# Patient Record
Sex: Female | Born: 1955 | Race: White | Hispanic: No | Smoking: Never smoker
Health system: Southern US, Community
[De-identification: ages and names within clinical notes are randomized; demographics above are authoritative.]

## PROBLEM LIST (undated history)

## (undated) DIAGNOSIS — Z9889 Other specified postprocedural states: Secondary | ICD-10-CM

## (undated) DIAGNOSIS — R112 Nausea with vomiting, unspecified: Secondary | ICD-10-CM

## (undated) DIAGNOSIS — R011 Cardiac murmur, unspecified: Secondary | ICD-10-CM

## (undated) DIAGNOSIS — C50919 Malignant neoplasm of unspecified site of unspecified female breast: Principal | ICD-10-CM

## (undated) HISTORY — DX: Malignant neoplasm of unspecified site of unspecified female breast: C50.919

## (undated) HISTORY — PX: BREAST EXCISIONAL BIOPSY: SUR124

## (undated) HISTORY — PX: BREAST LUMPECTOMY: SHX2

---

## 1984-01-22 HISTORY — PX: MYOMECTOMY: SHX85

## 1998-03-01 ENCOUNTER — Encounter: Payer: Self-pay | Admitting: *Deleted

## 1998-03-01 ENCOUNTER — Ambulatory Visit (HOSPITAL_COMMUNITY): Admission: RE | Admit: 1998-03-01 | Discharge: 1998-03-01 | Payer: Self-pay | Admitting: *Deleted

## 1998-10-20 ENCOUNTER — Ambulatory Visit (HOSPITAL_COMMUNITY): Admission: RE | Admit: 1998-10-20 | Discharge: 1998-10-20 | Payer: Self-pay | Admitting: Obstetrics and Gynecology

## 1999-10-08 ENCOUNTER — Other Ambulatory Visit: Admission: RE | Admit: 1999-10-08 | Discharge: 1999-10-08 | Payer: Self-pay | Admitting: Obstetrics and Gynecology

## 1999-10-09 ENCOUNTER — Encounter: Admission: RE | Admit: 1999-10-09 | Discharge: 1999-10-09 | Payer: Self-pay | Admitting: Obstetrics and Gynecology

## 1999-10-09 ENCOUNTER — Encounter: Payer: Self-pay | Admitting: Obstetrics and Gynecology

## 2000-01-31 ENCOUNTER — Other Ambulatory Visit: Admission: RE | Admit: 2000-01-31 | Discharge: 2000-01-31 | Payer: Self-pay | Admitting: Obstetrics and Gynecology

## 2002-09-15 ENCOUNTER — Encounter: Admission: RE | Admit: 2002-09-15 | Discharge: 2002-09-15 | Payer: Self-pay | Admitting: Obstetrics and Gynecology

## 2002-09-15 ENCOUNTER — Encounter: Payer: Self-pay | Admitting: Obstetrics and Gynecology

## 2004-06-27 ENCOUNTER — Encounter: Admission: RE | Admit: 2004-06-27 | Discharge: 2004-06-27 | Payer: Self-pay | Admitting: Obstetrics and Gynecology

## 2010-01-21 HISTORY — PX: MASTECTOMY: SHX3

## 2010-11-15 ENCOUNTER — Other Ambulatory Visit: Payer: Self-pay | Admitting: Obstetrics and Gynecology

## 2010-11-15 DIAGNOSIS — N6320 Unspecified lump in the left breast, unspecified quadrant: Secondary | ICD-10-CM

## 2010-11-28 ENCOUNTER — Ambulatory Visit
Admission: RE | Admit: 2010-11-28 | Discharge: 2010-11-28 | Disposition: A | Discharge status: Home / Self Care | Payer: BC Managed Care – PPO | Source: Ambulatory Visit | Attending: Obstetrics and Gynecology | Admitting: Obstetrics and Gynecology

## 2010-11-28 ENCOUNTER — Other Ambulatory Visit: Payer: Self-pay | Admitting: Obstetrics and Gynecology

## 2010-11-28 ENCOUNTER — Other Ambulatory Visit: Payer: Self-pay | Admitting: Diagnostic Radiology

## 2010-11-28 DIAGNOSIS — N6320 Unspecified lump in the left breast, unspecified quadrant: Secondary | ICD-10-CM

## 2010-11-29 ENCOUNTER — Ambulatory Visit
Admission: RE | Admit: 2010-11-29 | Discharge: 2010-11-29 | Disposition: A | Discharge status: Home / Self Care | Payer: BC Managed Care – PPO | Source: Ambulatory Visit | Attending: Obstetrics and Gynecology | Admitting: Obstetrics and Gynecology

## 2010-11-29 DIAGNOSIS — N6320 Unspecified lump in the left breast, unspecified quadrant: Secondary | ICD-10-CM

## 2010-11-30 ENCOUNTER — Telehealth: Payer: Self-pay | Admitting: *Deleted

## 2010-11-30 ENCOUNTER — Other Ambulatory Visit: Payer: Self-pay | Admitting: Obstetrics and Gynecology

## 2010-11-30 ENCOUNTER — Other Ambulatory Visit: Payer: Self-pay | Admitting: *Deleted

## 2010-11-30 DIAGNOSIS — C50919 Malignant neoplasm of unspecified site of unspecified female breast: Secondary | ICD-10-CM

## 2010-11-30 DIAGNOSIS — C50912 Malignant neoplasm of unspecified site of left female breast: Secondary | ICD-10-CM

## 2010-11-30 NOTE — Telephone Encounter (Signed)
Confirmed [facility] appt for 12/05/10.  Gave pt instructions and contact information, should questions arise.

## 2010-12-03 ENCOUNTER — Ambulatory Visit
Admission: RE | Admit: 2010-12-03 | Discharge: 2010-12-03 | Disposition: A | Discharge status: Home / Self Care | Payer: BC Managed Care – PPO | Source: Ambulatory Visit | Attending: Obstetrics and Gynecology | Admitting: Obstetrics and Gynecology

## 2010-12-03 DIAGNOSIS — C50912 Malignant neoplasm of unspecified site of left female breast: Secondary | ICD-10-CM

## 2010-12-03 MED ORDER — GADOBENATE DIMEGLUMINE 529 MG/ML IV SOLN
13.0000 mL | Freq: Once | INTRAVENOUS | Status: AC | PRN
  Administered 2010-12-03: 13 mL via INTRAVENOUS

## 2010-12-04 ENCOUNTER — Other Ambulatory Visit: Payer: Self-pay | Admitting: Obstetrics and Gynecology

## 2010-12-04 DIAGNOSIS — R921 Mammographic calcification found on diagnostic imaging of breast: Secondary | ICD-10-CM

## 2010-12-05 ENCOUNTER — Ambulatory Visit
Admission: RE | Admit: 2010-12-05 | Discharge: 2010-12-05 | Disposition: A | Discharge status: Home / Self Care | Payer: BC Managed Care – PPO | Source: Ambulatory Visit | Attending: Radiation Oncology | Admitting: Radiation Oncology

## 2010-12-05 ENCOUNTER — Ambulatory Visit (HOSPITAL_BASED_OUTPATIENT_CLINIC_OR_DEPARTMENT_OTHER): Payer: BC Managed Care – PPO | Admitting: Oncology

## 2010-12-05 ENCOUNTER — Encounter: Payer: Self-pay | Admitting: Oncology

## 2010-12-05 ENCOUNTER — Ambulatory Visit: Payer: BC Managed Care – PPO

## 2010-12-05 ENCOUNTER — Telehealth: Payer: Self-pay | Admitting: *Deleted

## 2010-12-05 ENCOUNTER — Ambulatory Visit (HOSPITAL_BASED_OUTPATIENT_CLINIC_OR_DEPARTMENT_OTHER): Payer: BC Managed Care – PPO | Admitting: General Surgery

## 2010-12-05 ENCOUNTER — Encounter (INDEPENDENT_AMBULATORY_CARE_PROVIDER_SITE_OTHER): Payer: Self-pay | Admitting: General Surgery

## 2010-12-05 ENCOUNTER — Ambulatory Visit: Payer: BC Managed Care – PPO | Attending: General Surgery | Admitting: Physical Therapy

## 2010-12-05 ENCOUNTER — Other Ambulatory Visit (HOSPITAL_BASED_OUTPATIENT_CLINIC_OR_DEPARTMENT_OTHER): Payer: BC Managed Care – PPO | Admitting: Lab

## 2010-12-05 VITALS — BP 132/75 | HR 59 | Temp 98.4°F | Ht 70.1 in | Wt 145.1 lb

## 2010-12-05 DIAGNOSIS — C50419 Malignant neoplasm of upper-outer quadrant of unspecified female breast: Secondary | ICD-10-CM

## 2010-12-05 DIAGNOSIS — C50919 Malignant neoplasm of unspecified site of unspecified female breast: Secondary | ICD-10-CM

## 2010-12-05 DIAGNOSIS — C50912 Malignant neoplasm of unspecified site of left female breast: Secondary | ICD-10-CM

## 2010-12-05 DIAGNOSIS — IMO0001 Reserved for inherently not codable concepts without codable children: Secondary | ICD-10-CM | POA: Insufficient documentation

## 2010-12-05 DIAGNOSIS — Z170 Estrogen receptor positive status [ER+]: Secondary | ICD-10-CM

## 2010-12-05 DIAGNOSIS — C50412 Malignant neoplasm of upper-outer quadrant of left female breast: Secondary | ICD-10-CM | POA: Insufficient documentation

## 2010-12-05 HISTORY — DX: Malignant neoplasm of unspecified site of unspecified female breast: C50.919

## 2010-12-05 LAB — CBC WITH DIFFERENTIAL/PLATELET
Basophils Absolute: 0 10*3/uL (ref 0.0–0.1)
Eosinophils Absolute: 0 10*3/uL (ref 0.0–0.5)
HCT: 39 % (ref 34.8–46.6)
HGB: 13.4 g/dL (ref 11.6–15.9)
MCV: 89.6 fL (ref 79.5–101.0)
MONO%: 8.1 % (ref 0.0–14.0)
NEUT#: 5.1 10*3/uL (ref 1.5–6.5)
NEUT%: 72 % (ref 38.4–76.8)
Platelets: 186 10*3/uL (ref 145–400)
RDW: 12.6 % (ref 11.2–14.5)

## 2010-12-05 LAB — COMPREHENSIVE METABOLIC PANEL
Albumin: 4 g/dL (ref 3.5–5.2)
Alkaline Phosphatase: 55 U/L (ref 39–117)
BUN: 9 mg/dL (ref 6–23)
Calcium: 9.5 mg/dL (ref 8.4–10.5)
Creatinine, Ser: 0.83 mg/dL (ref 0.50–1.10)
Glucose, Bld: 85 mg/dL (ref 70–99)
Potassium: 3.5 mEq/L (ref 3.5–5.3)

## 2010-12-05 NOTE — Patient Instructions (Signed)
You will be scheduled for surgery for your left breast cancer. The surgery that we have decided upon is a left total mastectomy and left axillary sentinel node biopsy. My office will coordinate the surgery with you. You will also be referred to a plastic surgeon to discuss reconstructive options for the future.                                                           Mastectomy, With or Without Reconstruction Mastectomy (removal of the breast) is a procedure most commonly used to treat cancer (tumor) of the breast. Different procedures are available for treatment. This depends on the stage of the tumor (abnormal growths). Discuss this with your caregiver, surgeon (a specialist for performing operations such as this), or oncologist (someone specialized in the treatment of cancer). With proper information, you can decide which treatment is best for you. Although the sound of the word cancer is frightening to all of [place], the new treatments and medications can be a source of reassurance and comfort. If there are things you are worried about, discuss them with your caregiver. He or she can help comfort you and your family. Some of the different procedures for treating breast cancer are:  Radical (extensive) mastectomy. This is an operation used to remove the entire breast, the muscles under the breast, and all of the glands (lymph nodes) under the arm. With all of the new treatments available for cancer of the breast, this procedure has become less common.   Modified radical mastectomy. This is a similar operation to the radical mastectomy described above. In the modified radical mastectomy, the muscles of the chest wall are not removed unless one of the lessor muscles is removed. One of the lessor muscles may be removed to allow better removal of the lymph nodes. The axillary lymph nodes are also removed. Rarely, during an axillary node dissection nerves to this area are damaged. Radiation therapy is then often  used to the area following this surgery.   A total mastectomy also known as a complete or simple mastectomy. It involves removal of only the breast. The lymph nodes and the muscles are left in place.   In a lumpectomy, the lump is removed from the breast. This is the simplest form of surgical treatment. A sentinel lymph node biopsy may also be done. Additional treatment may be required.  RISKS AND COMPLICATIONS The main problems that follow removal of the breast include:  Infection (germs start growing in the wound). This can usually be treated with antibiotics (medications that kill germs).   Lymphedema. This means the arm on the side of the breast that was operated on swells because the lymph (tissue fluid) cannot follow the main channels back into the body. This only occurs when the lymph nodes have had to be removed under the arm.   There may be some areas of numbness to the upper arm and around the incision (cut by the surgeon) in the breast. This happens because of the cutting of or damage to some of the nerves in the area. This is most often unavoidable.   There may be difficultymoving the arm in a full range of motion (moving in all directions) following surgery. This usually improves with time following use and exercise.  Recurrence of breast cancer may happen with the very best of surgery and follow up treatment. Sometimes small cancer cells that cannot be seen with the naked eye have already spread at the time of surgery. When this happens other treatment is available. This treatment may be radiation, medications or a combination of both.  RECONSTRUCTION Reconstruction of the breast may be done immediately if there is not going to be post-operative radiation. This surgery is done for cosmetic (improve appearance) purposes to improve the physical appearance after the operation. This may be done in two ways:  It can be done using a saline filled prosthetic (an artificial breast  which is filled with salt water). Silicone breast implants are now re-approved by the FDA and are being commonly used.   Reconstruction can be done using the body's own muscle/fat/skin.  Your caregiver will discuss your options with you. Depending upon your needs or choice, together you will be able to determine which procedure is best for you. Document Released: 10/02/2000 Document Revised: 09/19/2010 Document Reviewed: 05/26/2007 [facility] Patient Information 2012 [place], [place]. office will coordinate the surgery with you. We are also going to refer you to a plastic surgeon to discuss future options for reconstruction.

## 2010-12-05 NOTE — Progress Notes (Signed)
No chief complaint on file.   HPI Daisy Thomas is a 55 y.o. female.  She is referred by Dr. Norva Pavlov at the breast center of [facility] for evaluation of a newly diagnosed invasive cancer of the left breast.  The patient states that she felt a lump in the superior aspect of her left breast recently. She says this is large. Mammogram and ultrasound show a mass at the 12:00 position. There is also a separate, area of suspicious calcifications medially at the 9:00 position. Image guided biopsy of the mass at the 12:00 position reveals invasive lobular carcinoma, ER-positive, PR positive, HER-2/neu negative, Ki-67 15%. This is a clinical stage T2, N0 tumor. The area of calcifications at the 9 by position has not been biopsied yet.  The patient is only motivated to proceed with mastectomy, and is not interested in biopsying the second area of calcifications. We talked about this at length. We talked about the difference between lumpectomy and mastectomy. We talked about sentinel node biopsy. We talked about the differences between ductal carcinoma and lobular carcinoma. She has been offered a referral to a Engineer, petroleum and she is going to do that to discuss options, however she states that she really would rather have the reconstruction  in a delayed fashion, not immediate.  She is otherwise healthy. She does not smoke. She is on no medications.  Family history reveals breast cancer in an aunt at age 29. Otherwise no familial cancer history. HPI  Past Medical History  Diagnosis Date  . Diverticulitis 2010  . Breast cancer 12/05/2010    Past Surgical History  Procedure Date  . Myomectomy 1986    ovarian fibroid  . Cesarean section 1990  . Breast lumpectomy     left breast     No family history on file.  Social History History  Substance Use Topics  . Smoking status: Never Smoker   . Smokeless tobacco: Never Used  . Alcohol Use: No    No Known Allergies  No current  outpatient prescriptions on file.    Review of Systems Review of Systems  Constitutional: Negative for fever, chills and unexpected weight change.  HENT: Negative for hearing loss, congestion, sore throat, trouble swallowing and voice change.   Eyes: Negative for visual disturbance.  Respiratory: Negative for cough and wheezing.   Cardiovascular: Negative for chest pain, palpitations and leg swelling.  Gastrointestinal: Negative for nausea, vomiting, abdominal pain, diarrhea, constipation, blood in stool, abdominal distention and anal bleeding.  Genitourinary: Negative for hematuria, vaginal bleeding and difficulty urinating.  Musculoskeletal: Negative for arthralgias.  Skin: Negative for rash and wound.  Neurological: Negative for seizures, syncope and headaches.  Hematological: Negative for adenopathy. Does not bruise/bleed easily.  Psychiatric/Behavioral: Negative for confusion.    There were no vitals taken for this visit.  Physical Exam Physical Exam  Constitutional: She is oriented to person, place, and time. She appears well-developed and well-nourished. No distress.  HENT:  Head: Normocephalic and atraumatic.  Nose: Nose normal.  Mouth/Throat: No oropharyngeal exudate.  Eyes: Conjunctivae and EOM are normal. Pupils are equal, round, and reactive to light. Left eye exhibits no discharge. No scleral icterus.  Neck: Neck supple. No JVD present. No tracheal deviation present. No thyromegaly present.  Cardiovascular: Normal rate, regular rhythm, normal heart sounds and intact distal pulses.   No murmur heard. Pulmonary/Chest: Effort normal and breath sounds normal. No respiratory distress. She has no wheezes. She has no rales. She exhibits no tenderness.  Abdominal: Soft. Bowel sounds are normal. She exhibits no distension and no mass. There is no tenderness. There is no rebound and no guarding.       C-section scar  Musculoskeletal: She exhibits no edema and no tenderness.    Lymphadenopathy:    She has no cervical adenopathy.  Neurological: She is alert and oriented to person, place, and time. She exhibits normal muscle tone. Coordination normal.  Skin: Skin is warm. No rash noted. She is not diaphoretic. No erythema. No pallor.  Psychiatric: She has a normal mood and affect. Her behavior is normal. Judgment and thought content normal.    Data Reviewed I have reviewed her imaging studies and her pathology. I have discussed her case in breast conference this morning. I have consulted Dr. Drue Second and Dr. Antony Blackbird regarding treatment options. I have referred her to a Engineer, petroleum for consultation.  Assessment    Invasive lobular carcinoma, left breast, 12:00 position, 3 cm tumor by exam. ER positive. PR positive. HER-2/neu negative. Ki 67 15%.  Patient desires proceeding with left total mastectomy and sentinel node biopsy.    Plan    The patient will be scheduled for left total mastectomy with sentinel lymph node mapping and biopsy.  The patient will also be referred for plastic surgery consultation, however she states she is not interested in immediate reconstruction. She is considering delayed reconstruction.  I have discussed the indications and details of her surgery with her. Risks and complications have been outlined, including but not limited to bleeding, infection, skin necrosis, reoperation for positive margins or positive nodes, arm numbness, or swelling, shoulder contracture, cardiac pulmonary and thromboembolic problems. She seems to understand these issues well. At this time all of her questions were answered. She is in full agreement with this plan.       Daisy Thomas M 12/05/2010, 3:50 PM

## 2010-12-05 NOTE — Progress Notes (Signed)
CC:   Malachi Pro. Ambrose Mantle, M.D. Angelia Mould. Derrell Lolling, M.D. Drue Second, M.D.  DIAGNOSIS:  Clinical stage II invasive lobular carcinoma of the left breast.  REQUESTING PHYSICIAN:  Dr. Malachi Pro. Henley.  HISTORY OF PRESENT ILLNESS:  Daisy Thomas is a very pleasant 55 year old female who seen out of the courtesy of Dr. Tracey Harries as part of the multidisciplinary breast clinic.  Earlier this year, Daisy Thomas palpated a mass within the upper medial aspect of her left breast.  The patient subsequently proceeded to undergo bilateral digital diagnostic mammography and left breast ultrasound.  The patient was noted to have an extremely dense breast.  However, within the upper central portion of the left breast, there was distortion associated with a partially visualized irregular mass.  Ultrasound of the left breast reveals an irregular hypoechoic mass in the 12 o'clock position approximately 3 cm from the nipple and measuring 2.9 x 1.5 x 3.8 cm.  The patient proceeded to undergo biopsy of this area revealing invasive mammary carcinoma.  E- cadherin stain was negative, confirming the lobular nature of the patient's carcinoma.  It was estrogen receptor positive at 99% and progesterone receptor positive at 100%.  Proliferation marker was 13% (Ki-67).  In light of the above findings, the patient did proceed to undergo an MRI of the breast region which showed a 2.1 x 2.0 x 2.1 cm enhancing mass within the 12 o'clock position of the left breast.  In addition, there was a cluster of calcifications within the medial aspect of the left breast which were suspicious for malignancy, and stereotactic biopsy was recommended at a later date.  Due to the above findings, the patient is now seen in the multidisciplinary breast clinic.  PAST MEDICAL HISTORY:  The patient has no known drug allergies.  The patient is currently on no prescription medications.  The patient denies any chronic medical problems.   The patient did have a history of diverticulitis in 2010.  Prior surgeries include myomectomy in '86 for a fibroid.  The patient also had a C-section in 1990.  A lumpectomy of the left breast for benign reasons.  The patient has also had a biopsy of the right breast for calcifications, which was also benign.  SOCIAL HISTORY:  The patient lives in the [place] area ([place], [place]).  The patient, however, works full time in the [place] area and has an apartment in [place] for staying overnight during the weekdays.  The patient is a Public house manager Loss adjuster, chartered for qualified retirement plans).  The patient has 1 son who is a Consulting civil engineer at [employer] There is no history of tobacco use.  The patient has rare alcohol intake approximately once per month.  FAMILY HISTORY:  Significant for her a maternal aunt with a history of breast cancer in her early 11s, also significant for a maternal grandmother with a history of lung cancer in her 48s.  There is no family history of ovarian cancer.  REVIEW OF SYSTEMS:  As above, the patient palpated the area of concern on self-examination.  She denied any pain in this area, nipple discharge or bleeding prior to diagnosis.  The patient denies any new bony pain, headaches, dizziness or blurred vision.  A complete review of systems is undertaken with the patient as well as reviewed in the electronic medical record and, other than the above issues, is unremarkable.  PHYSICAL EXAMINATION:  General:  This is a very pleasant, healthy- appearing 55 year old female in no acute distress.  The patient is accompanied by a good friend as well as her sister on evaluation today. Lymphatics:  Examination of the neck and supraclavicular region reveals no evidence of adenopathy.  The axillary areas are free of adenopathy. Lungs:  Examination of the lungs reveals them to be clear.  Heart:  The heart has a regular rhythm and rate.  Breasts:  Examination of  the right breast reveals no mass or nipple discharge.  There are some fibrocystic changes noted throughout the breast.  Examination of the left breast reveals a palpable mass in the upper aspect of the breast at the 11 to 12 o'clock position.  This is estimated to be approximately 5 cm in size.  There is no nipple discharge or bleeding noted.  There is no other palpable abnormality within the breast.  When lying flat, the breast mass does protrude out from the adjacent breast tissue, but no obvious skin involvement is noted.  IMPRESSION AND PLAN:  Clinical stage II invasive lobular carcinoma of the left breast.  The patient has a rather large palpable mass particularly as it relates to the patient's breast size.  At this point, it would be very difficult for the patient to proceed with breast conserving therapy without significant architectural distortion of the breast.  The patient has an invasive lobular tumor that may not respond well to neoadjuvant treatment.  Today, I did discuss with patient consideration for possible neoadjuvant treatment but would recommend biopsy of the 2nd area of concern in the medial aspect of the breast. If this biopsy is positive, then the patient in would be considered to have multicentric disease and would not be a candidate for breast conserving therapy.  At this point, the patient does seem to be leaning towards a mastectomy in the above issues.  The patient has already met with Dr. Welton Flakes and is leaning towards this treatment approach.  In addition, the patient will be meeting with Dr. Derrell Lolling later today for further discussion of surgical options.  It the patient does proceed with a mastectomy, given the size of the lesion and depending on the final pathologic results, the patient may benefit from postmastectomy irradiation.  In light of the potential recommendation for postmastectomy irradiation, I did discuss with Daisy Thomas that she not consider  immediate reconstruction but did encourage her to pursue plastic surgery evaluation as part of her overall evaluation.  In addition, given the patient's work in the [place] area, if radiation therapy is recommended, she may for convenience issues wish to pursue radiation therapy in this area.    ______________________________ Billie Lade, Ph.D., M.D. JDK/MEDQ  D:  12/05/2010  T:  12/05/2010  Job:  [phone]

## 2010-12-05 NOTE — Progress Notes (Signed)
Daisy Thomas [id number] 22-Mar-1955 55 y.o. 12/05/2010 8:46 PM  CC Dr. Claud Kelp Dr. Antony Blackbird  REASON FOR CONSULTATION:  55 yo with new invasive lobular carcinoma of the left breast. Being seen in the [facility] for discussion of treatment options   REFERRING PHYSICIAN: Dr. Cain Saupe  HISTORY OF PRESENT ILLNESS:  Daisy Thomas is a 55 y.o. female.  With  Out significant past medical history who felt a mass in her left breast in the upper outer quadrant. She went for a diagnostic mammogram. The mammogram showed a mass in the outer quadrant. Ultrasound showed the mass  At the 12:00 position and measured about 3 cm. Biopsy revealed an invasive lobular carcinoma, ER positive, PR+ with Ki-67 at 15%, and Her2Neu negative. MRI of breasts confirmed the mass. Patient now seen in [facility] for treatment options.   Past Medical History  Diagnosis Date  . Diverticulitis 2010  . Breast cancer 12/05/2010    Past Surgical History  Procedure Date  . Myomectomy 1986    ovarian fibroid  . Cesarean section 1990  . Breast lumpectomy     left breast     FHX: Aunt with menausal breast cancer, no other history of breast, ovarian, lung , endometrial, or other malignancies  Social History History  Substance Use Topics  . Smoking status: Never Smoker   . Smokeless tobacco: Never Used  . Alcohol Use: No  divorced GYN HX: menarche at 16 she is post-menopausal, 1 full term pregancy, no previous use of HRT ALLERGIES No Known Allergies  Current Meds: none  REVIEW OF SYSTEMS:  Constitutional: negative Ears, nose, mouth, throat, and face: negative Respiratory: negative Cardiovascular: negative Gastrointestinal: negative Integument/breast: positive for breast lump and in the left breast in the upper outer quadrant meassuring about 5 cm Hematologic/lymphatic: negative Musculoskeletal:negative Neurological: negative  PHYSICAL EXAMINATION:  ION:GEXBM, healthy, no distress, well  nourished and well developed SKIN: skin color, texture, turgor are normal HEAD: No masses, lesions, tenderness or abnormalities EYES: normal, PERRLA, EOMI, Conjunctiva are pink and non-injected EARS: External ears normal OROPHARYNX:no exudate, no erythema and lips, buccal mucosa, and tongue normal  NECK: supple, no adenopathy, no bruits, no JVD, thyroid normal size, non-tender, without nodularity LYMPH:  no palpable lymphadenopathy, no hepatosplenomegaly BREAST:right breast normal without mass, skin or nipple changes or axillary nodes, abnormal mass palpable in the left breast  LUNGS: clear to auscultation and percussion HEART: regular rate & rhythm, no murmurs and no gallops ABDOMEN:abdomen soft, non-tender, normal bowel sounds and no masses or organomegaly BACK: Back symmetric, no curvature., No CVA tenderness, Range of motion is normal EXTREMITIES:no joint deformities, effusion, or inflammation, no edema  NEURO: alert & oriented x 3 with fluent speech, no focal motor/sensory deficits, gait normal, reflexes normal and symmetric    STUDIES/RESULTS: US Breast Left  2010/12/08  **ADDENDUM** CREATED: December 08, 2010 11:40:26  Corrected report:  *RADIOLOGY REPORT*  Clinical Data:  Status post ultrasound guided core biopsy of mass in the 12 o'clock location of the left breast.  DIGITAL DIAGNOSTIC LEFT MAMMOGRAM  Comparison:  Pre biopsy exams of 12/08/2010  Findings:  Films are performed following ultrasound guided biopsy of mass in the 12 o'clock location of the left breast.  A ribbon shaped clip is identified within the mass in the 12 o'clock location of the left breast.  IMPRESSION: Tissue marker clip is in expected location after biopsy.  Addended by:  Blair Hailey. Manson Passey, M.D. on 2010/12/08 11:40:26.  **END ADDENDUM** SIGNED BY: Blair Hailey. Manson Passey, M.D.  11/28/2010  *RADIOLOGY REPORT*  Clinical Data:  Palpable abnormality in the left breast.  DIGITAL DIAGNOSTIC BILATERAL MAMMOGRAM WITH CAD AND LEFT  BREAST ULTRASOUND:  Comparison:  06/27/2004  Findings:  Breast parenchyma is extremely dense.  Within the upper central portion of the left breast, there is distortion, associated with a partially visualized irregular mass.  This is marked with a BB as the area of patient's concern.  Spot tangential view confirms the above findings.  Within the medial portion of the left breast, there is a 6 mm cluster of calcifications.  Calcifications vary in size shape and density.  Images of the right breast are negative. Mammographic images were processed with CAD.  On physical exam, I palpate a firm mass in the 12 o'clock location of the left breast 3 cm from the nipple.  Ultrasound is performed, showing an irregular hypoechoic mass with internal blood flow in the 12 o'clock location of the left breast 3 cm from the nipple.  There is associated increased through transmission.  Mass measures 2.9 x 1.5 x 3.0 cm.  At ultrasound, faint, punctate internal calcifications are suspected.  Evaluation of the left axilla is negative.  IMPRESSION:  1.  Palpable mass is suspicious for invasive ductal carcinoma by ultrasound and mammogram.  Ultrasound-guided core biopsy is recommended. 2.  Cluster of calcifications in the medial aspect of the left breast is also suspicious.  Pending the biopsy of the mass and possible MRI, stereotactic guided core biopsy of this cluster of calcifications should be considered. 3.  No ultrasound evidence for axillary adenopathy.  I have discussed the findings with the patient.  She requests biopsy today.  This is performed and dictated separately.  BI-RADS CATEGORY 4:  Suspicious abnormality - biopsy should be considered.  Original Report Authenticated By: Patterson Hammersmith, M.D.   Mr Breast Bilateral W Wo Contrast  12/03/2010  *RADIOLOGY REPORT*  Clinical Data: Recently diagnosed invasive mammary carcinoma in the 12 o'clock region of the left breast.  At the time of the diagnostic study a cluster of  calcifications worrisome for DCIS was seen in the medial portion of the left breast.  BILATERAL BREAST MRI WITH AND WITHOUT CONTRAST  Technique: Multiplanar, multisequence MR images of both breasts were obtained prior to and following the intravenous administration of 13ml of multihance.  Three dimensional images were evaluated at the independent DynaCad workstation.  Comparison:  Mammograms dated 11/28/2010  Findings: There is a moderate background parenchymal enhancement pattern.  In the 12 o'clock region of the left breast there is a 2.1 x 2.0 x 2.1 cm enhancing mass associated with a biopsy clip artifact.  This corresponds well with the known malignancy.  No other areas of abnormal enhancement are seen in the left breast.  There is no enlarged axillary or internal mammary adenopathy.  No abnormal enhancement is seen in the right breast.  IMPRESSION:  1.  2.1 cm mass in the 12 o'clock region of the left breast corresponding well with the known malignancy. 2.  The cluster of calcifications in the medial aspect of the left breast are suspicious.  A stereotactic biopsy is recommended and will be scheduled at the patient's convenience.  THREE-DIMENSIONAL MR IMAGE RENDERING ON INDEPENDENT WORKSTATION:  Three-dimensional MR images were rendered by post-processing of the original MR data on an independent workstation.  The three- dimensional MR images were interpreted, and findings were reported in the accompanying complete MRI report for this study.  BI-RADS CATEGORY 6:  Known biopsy-proven malignancy -  appropriate action should be taken.  Original Report Authenticated By: Littie Deeds. Judyann Munson, M.D.   [place] Core Biopsy  11/28/2010  *RADIOLOGY REPORT*  Clinical Data:  Mass in the 12 o'clock location of the left breast.  ULTRASOUND GUIDED VACUUM ASSISTED CORE BIOPSY OF THE LEFT BREAST  I met with the patient, and we discussed the procedure of ultrasound-guided biopsy, including risks, benefits, and alternatives.  Specifically,  we discussed the risks of infection, bleeding, tissue injury, clip migration, and inadequate sampling. Informed, written consent was given.  Using sterile technique, 2% lidocaine, ultrasound guidance, and a 12 gauge vacuum assisted needle, biopsy was performed of left breast mass, using a lateral approach.  At the conclusion of the procedure, a ribbon shaped tissue marker clip was deployed into the biopsy cavity.  Follow-up 2-view mammogram was performed and dictated separately.  IMPRESSION: Ultrasound-guided biopsy of left breast mass.  No apparent complications.  Original Report Authenticated By: Patterson Hammersmith, M.D.   Mm Digital Diagnostic Bilat  11/28/2010  *RADIOLOGY REPORT*  Clinical Data:  Palpable abnormality in the left breast.  DIGITAL DIAGNOSTIC BILATERAL MAMMOGRAM WITH CAD AND LEFT BREAST ULTRASOUND:  Comparison:  06/27/2004  Findings:  Breast parenchyma is extremely dense.  Within the upper central portion of the left breast, there is distortion, associated with a partially visualized irregular mass.  This is marked with a BB as the area of patient's concern.  Spot tangential view confirms the above findings.  Within the medial portion of the left breast, there is a 6 mm cluster of calcifications.  Calcifications vary in size shape and density.  Images of the right breast are negative. Mammographic images were processed with CAD.  On physical exam, I palpate a firm mass in the 12 o'clock location of the left breast 3 cm from the nipple.  Ultrasound is performed, showing an irregular hypoechoic mass with internal blood flow in the 12 o'clock location of the left breast 3 cm from the nipple.  There is associated increased through transmission.  Mass measures 2.9 x 1.5 x 3.0 cm.  At ultrasound, faint, punctate internal calcifications are suspected.  Evaluation of the left axilla is negative.  IMPRESSION:  1.  Palpable mass is suspicious for invasive ductal carcinoma by ultrasound and mammogram.   Ultrasound-guided core biopsy is recommended. 2.  Cluster of calcifications in the medial aspect of the left breast is also suspicious.  Pending the biopsy of the mass and possible MRI, stereotactic guided core biopsy of this cluster of calcifications should be considered. 3.  No ultrasound evidence for axillary adenopathy.  I have discussed the findings with the patient.  She requests biopsy today.  This is performed and dictated separately.  BI-RADS CATEGORY 4:  Suspicious abnormality - biopsy should be considered.  Original Report Authenticated By: Patterson Hammersmith, M.D.   Mm Digital Diagnostic Unilat L  11/28/2010  **ADDENDUM** CREATED: 11/28/2010 11:40:26  Corrected report:  *RADIOLOGY REPORT*  Clinical Data:  Status post ultrasound guided core biopsy of mass in the 12 o'clock location of the left breast.  DIGITAL DIAGNOSTIC LEFT MAMMOGRAM  Comparison:  Pre biopsy exams of 11/28/2010  Findings:  Films are performed following ultrasound guided biopsy of mass in the 12 o'clock location of the left breast.  A ribbon shaped clip is identified within the mass in the 12 o'clock location of the left breast.  IMPRESSION: Tissue marker clip is in expected location after biopsy.  Addended by:  Blair Hailey. Manson Passey, M.D. on 11/28/2010 11:40:26.  **  END ADDENDUM** SIGNED BY: Blair Hailey. Manson Passey, M.D.   11/28/2010  *RADIOLOGY REPORT*  Clinical Data:  Palpable abnormality in the left breast.  DIGITAL DIAGNOSTIC BILATERAL MAMMOGRAM WITH CAD AND LEFT BREAST ULTRASOUND:  Comparison:  06/27/2004  Findings:  Breast parenchyma is extremely dense.  Within the upper central portion of the left breast, there is distortion, associated with a partially visualized irregular mass.  This is marked with a BB as the area of patient's concern.  Spot tangential view confirms the above findings.  Within the medial portion of the left breast, there is a 6 mm cluster of calcifications.  Calcifications vary in size shape and density.  Images of  the right breast are negative. Mammographic images were processed with CAD.  On physical exam, I palpate a firm mass in the 12 o'clock location of the left breast 3 cm from the nipple.  Ultrasound is performed, showing an irregular hypoechoic mass with internal blood flow in the 12 o'clock location of the left breast 3 cm from the nipple.  There is associated increased through transmission.  Mass measures 2.9 x 1.5 x 3.0 cm.  At ultrasound, faint, punctate internal calcifications are suspected.  Evaluation of the left axilla is negative.  IMPRESSION:  1.  Palpable mass is suspicious for invasive ductal carcinoma by ultrasound and mammogram.  Ultrasound-guided core biopsy is recommended. 2.  Cluster of calcifications in the medial aspect of the left breast is also suspicious.  Pending the biopsy of the mass and possible MRI, stereotactic guided core biopsy of this cluster of calcifications should be considered. 3.  No ultrasound evidence for axillary adenopathy.  I have discussed the findings with the patient.  She requests biopsy today.  This is performed and dictated separately.  BI-RADS CATEGORY 4:  Suspicious abnormality - biopsy should be considered.  Original Report Authenticated By: Patterson Hammersmith, M.D.   Mm Radiologist Eval And Mgmt  11/29/2010  *RADIOLOGY REPORT*  ESTABLISHED PATIENT OFFICE VISIT - LEVEL II [phone])  Chief Complaint:  The patient returns for follow-up after ultrasound guided core biopsy of the left breast performed on 11/28/2010.  History:  The patient has a palpable mass in the left breast. Mammogram and ultrasound show an irregular mass, suspicious for malignancy.  Ultrasound-guided core biopsy was performed without complications.  Pathology shows invasive mammary carcinoma with mammary carcinoma in situ present.  E-Cadherin stains are pending to determine whether the carcinoma is ductal or lobular.  The pathology correlates well with the imaging appearance.  Exam:  Biopsy site is  clean and dry in the upper portion of the left breast.  No hematoma is palpable.  Mass remains palpable in the upper central aspect of the breast.  Assessment and Plan:  The patient lives in [place] and works in [place].  Her family is in [place].  She is considering the multidisciplinary clinic in [place] and will let us know her plan.  If she is seen in [place], we will schedule MRI prior to her clinic visit.  The patient was given educational materials and a clinic packet.  Questions were answered and the patient was provided with my card if she has additional questions.  Original Report Authenticated By: Patterson Hammersmith, M.D.   PATHOLOGY: Left breast biopsy: invasive lobular carcinoma, ER+/PR+, her2neu negative, ki-67 15% LAB DATA: Is reviewed  ASSESSMENT    55 yo with new diagnosis of T2Nx (stage II)invasive lobular carcinoma, seen in [facility] for treatment options,she was seen by Dr. Derrell Lolling and  Dr. Gillian Shields. Extensive discussion and rational for traeatment. Patient was given options of breast conservation with sentinel lymph node biopsy vs a mastectomy. The ration for both approaches were discussed in great detail. We discussed the option of neoadjuvant treatment with anti-estrogen for about 6 -9 months if she wanted breast conservation. We discussed systemic treatment post surgery as well. I recommended possibility of doing an oncotype dx for planning of adjuvant treatment. We discussed rational for this in her situation she does have a T2 tumor.    PLAN:    Patient has opted for mastectomy and with sentinal biopsy. Breast reconstruction was discussed with her. She will be eventually seen by Plastic surgery for a consultation. She will follow up with me in after her mastectomy. At that time we discuss adjuvant therapy and her onotype dx results     All questions were answered. The patient knows to call the clinic with any problems, questions or concerns. We can certainly see the  patient much sooner if necessary.  Thank you so much for allowing me to participate in the care of Daisy Thomas. I will continue to follow up the patient with you and assist in her care.  I spent 40 minutes counseling the patient face to face. The total time spent in the appointment was 60 minutes.   Drue Second, MD Medical/Oncology [facility] [phone] (beeper) [phone] (Office)  12/05/2010, 8:46 PM 12/05/2010, 8:46 PM

## 2010-12-05 NOTE — Telephone Encounter (Signed)
Gave patient appointment for 01-11-2011 starting at 10:30am with labs

## 2010-12-06 ENCOUNTER — Encounter: Payer: Self-pay | Admitting: *Deleted

## 2010-12-06 NOTE — Progress Notes (Signed)
Late Entry from 12/05/10= Pt relate she does not want to have breast bx.  She has decided to have Left Mastectomy with reconstruction.  Left VM for Cathy at [facility] to cancel breast bx.

## 2010-12-06 NOTE — Progress Notes (Signed)
Mailed after appt letter to pt. 

## 2010-12-10 ENCOUNTER — Telehealth: Payer: Self-pay | Admitting: *Deleted

## 2010-12-10 NOTE — Telephone Encounter (Signed)
Spoke to pt concerning [facility] from 12/05/10.  Pt denies questions or concerns.  Pt relate she understands dx, prognostic panel, and staging.  Encourage pt to call with needs.  Received verbal understanding.  Contact information given.

## 2010-12-11 ENCOUNTER — Other Ambulatory Visit (INDEPENDENT_AMBULATORY_CARE_PROVIDER_SITE_OTHER): Payer: Self-pay | Admitting: General Surgery

## 2010-12-11 ENCOUNTER — Encounter (HOSPITAL_COMMUNITY): Payer: Self-pay | Admitting: Pharmacy Technician

## 2010-12-11 ENCOUNTER — Other Ambulatory Visit (HOSPITAL_COMMUNITY): Payer: Self-pay | Admitting: *Deleted

## 2010-12-12 ENCOUNTER — Other Ambulatory Visit (INDEPENDENT_AMBULATORY_CARE_PROVIDER_SITE_OTHER): Payer: Self-pay | Admitting: General Surgery

## 2010-12-12 ENCOUNTER — Encounter: Payer: Self-pay | Admitting: *Deleted

## 2010-12-12 DIAGNOSIS — C50912 Malignant neoplasm of unspecified site of left female breast: Secondary | ICD-10-CM

## 2010-12-12 NOTE — Progress Notes (Signed)
Mailed after appt letter to pt. 

## 2010-12-17 ENCOUNTER — Encounter: Payer: Self-pay | Admitting: *Deleted

## 2010-12-17 NOTE — Progress Notes (Signed)
Pt is scheduled for left mastectomy with SLN bx on 12/21/10.

## 2010-12-18 ENCOUNTER — Encounter (HOSPITAL_COMMUNITY): Payer: Self-pay

## 2010-12-18 ENCOUNTER — Encounter (HOSPITAL_COMMUNITY)
Admission: RE | Admit: 2010-12-18 | Discharge: 2010-12-18 | Disposition: A | Discharge status: Home / Self Care | Payer: BC Managed Care – PPO | Source: Ambulatory Visit | Attending: General Surgery | Admitting: General Surgery

## 2010-12-18 HISTORY — DX: Nausea with vomiting, unspecified: R11.2

## 2010-12-18 HISTORY — DX: Cardiac murmur, unspecified: R01.1

## 2010-12-18 HISTORY — DX: Other specified postprocedural states: Z98.890

## 2010-12-18 LAB — COMPREHENSIVE METABOLIC PANEL
ALT: 13 U/L (ref 0–35)
AST: 12 U/L (ref 0–37)
Alkaline Phosphatase: 61 U/L (ref 39–117)
CO2: 29 mEq/L (ref 19–32)
Calcium: 9.7 mg/dL (ref 8.4–10.5)
Chloride: 105 mEq/L (ref 96–112)
GFR calc Af Amer: >90 mL/min (ref >90)
GFR calc non Af Amer: >90 mL/min (ref >90)
Glucose, Bld: 82 mg/dL (ref 70–99)
Potassium: 4 mEq/L (ref 3.5–5.1)
Sodium: 143 mEq/L (ref 135–145)
Total Bilirubin: 0.3 mg/dL (ref 0.3–1.2)

## 2010-12-18 LAB — DIFFERENTIAL
Basophils Absolute: 0 10*3/uL (ref 0.0–0.1)
Eosinophils Absolute: 0.1 10*3/uL (ref 0.0–0.7)
Eosinophils Relative: 1 % (ref 0–5)
Lymphocytes Relative: 23 % (ref 12–46)
Monocytes Absolute: 0.8 10*3/uL (ref 0.1–1.0)

## 2010-12-18 LAB — CBC
Hemoglobin: 13.7 g/dL (ref 12.0–15.0)
MCH: 29.8 pg (ref 26.0–34.0)
Platelets: 206 10*3/uL (ref 150–400)
RBC: 4.6 MIL/uL (ref 3.87–5.11)
WBC: 7.9 10*3/uL (ref 4.0–10.5)

## 2010-12-18 LAB — SURGICAL PCR SCREEN: MRSA, PCR: NEGATIVE

## 2010-12-18 MED ORDER — HEPARIN SODIUM (PORCINE) 5000 UNIT/ML IJ SOLN: 5000.0000 [IU] | Freq: Once | INTRAMUSCULAR | Status: DC

## 2010-12-18 NOTE — Pre-Procedure Instructions (Signed)
20 Daisy Thomas  12/18/2010   Your procedure is scheduled on:  12/21/10  Report to [facility] at 530 AM.  Call this number if you have problems the morning of surgery: [phone]   Remember:   Do not eat food:After Midnight.  May have clear liquids: up to 4 Hours before arrival.  Clear liquids include soda, tea, black coffee, apple or grape juice, broth.  Take these medicines the morning of surgery with A SIP OF WATER: NONE   Do not wear jewelry, make-up or nail polish.  Do not wear lotions, powders, or perfumes. You may wear deodorant.  Do not shave 48 hours prior to surgery.  Do not bring valuables to the hospital.  Contacts, dentures or bridgework may not be worn into surgery.  Leave suitcase in the car. After surgery it may be brought to your room.  For patients admitted to the hospital, checkout time is 11:00 AM the day of discharge.   Patients discharged the day of surgery will not be allowed to drive home.  Name and phone number of your driver: FAMILY  Special Instructions: CHG Shower Use Special Wash: 1/2 bottle night before surgery and 1/2 bottle morning of surgery.   Please read over the following fact sheets that you were given: Pain Booklet, MRSA Information and Surgical Site Infection Prevention

## 2010-12-20 MED ORDER — CEFAZOLIN SODIUM 1-5 GM-% IV SOLN
1.0000 g | INTRAVENOUS | Status: DC
  Filled 2010-12-20: qty 50

## 2010-12-20 NOTE — H&P (Signed)
Daisy Thomas   12/05/2010 3:00 PM Office Visit  MRN: [id number]   Description: 56 year old female  Provider: Ernestene Mention, MD  Department: [facility]        Diagnoses     Cancer of left breast   - Primary    174.9           Progress Notes     Ernestene Mention, MD  12/05/2010  4:02 PM  Signed No chief complaint on file.   HPI Daisy Thomas is a 55 y.o. female.  She is referred by Dr. Norva Thomas at the breast center of [facility] for evaluation of a newly diagnosed invasive cancer of the left breast.   The patient states that she felt a lump in the superior aspect of her left breast recently. She says this is large. Mammogram and ultrasound show a mass at the 12:00 position. There is also a separate, area of suspicious calcifications medially at the 9:00 position. Image guided biopsy of the mass at the 12:00 position reveals invasive lobular carcinoma, ER-positive, PR positive, HER-2/neu negative, Ki-67 15%. This is a clinical stage T2, N0 tumor. The area of calcifications at the 9 by position has not been biopsied yet.   The patient is only motivated to proceed with mastectomy, and is not interested in biopsying the second area of calcifications. We talked about this at length. We talked about the difference between lumpectomy and mastectomy. We talked about sentinel node biopsy. We talked about the differences between ductal carcinoma and lobular carcinoma. She has been offered a referral to a Engineer, petroleum and she is going to do that to discuss options, however she states that she really would rather have the reconstruction  in a delayed fashion, not immediate.   She is otherwise healthy. She does not smoke. She is on no medications.   Family history reveals breast cancer in an aunt at age 52. Otherwise no familial cancer history. HPI    Past Medical History   Diagnosis  Date   .  Diverticulitis  2010   .  Breast cancer  12/05/2010         Past Surgical History   Procedure  Date   .  Myomectomy  1986       ovarian fibroid   .  Cesarean section  1990   .  Breast lumpectomy         left breast       No family history on file.   Social History History   Substance Use Topics   .  Smoking status:  Never Smoker    .  Smokeless tobacco:  Never Used   .  Alcohol Use:  No      No Known Allergies    No current outpatient prescriptions on file.      Review of Systems Review of Systems  Constitutional: Negative for fever, chills and unexpected weight change.  HENT: Negative for hearing loss, congestion, sore throat, trouble swallowing and voice change.   Eyes: Negative for visual disturbance.  Respiratory: Negative for cough and wheezing.   Cardiovascular: Negative for chest pain, palpitations and leg swelling.  Gastrointestinal: Negative for nausea, vomiting, abdominal pain, diarrhea, constipation, blood in stool, abdominal distention and anal bleeding.  Genitourinary: Negative for hematuria, vaginal bleeding and difficulty urinating.  Musculoskeletal: Negative for arthralgias.  Skin: Negative for rash and wound.  Neurological: Negative for seizures, syncope and headaches.  Hematological: Negative for adenopathy. Does not  bruise/bleed easily.  Psychiatric/Behavioral: Negative for confusion.    There were no vitals taken for this visit.   Physical Exam Physical Exam  Constitutional: She is oriented to person, place, and time. She appears well-developed and well-nourished. No distress.  HENT:   Head: Normocephalic and atraumatic.   Nose: Nose normal.   Mouth/Throat: No oropharyngeal exudate.  Eyes: Conjunctivae and EOM are normal. Pupils are equal, round, and reactive to light. Left eye exhibits no discharge. No scleral icterus.  Neck: Neck supple. No JVD present. No tracheal deviation present. No thyromegaly present.  Cardiovascular: Normal rate, regular rhythm, normal heart sounds and intact distal  pulses.    No murmur heard. Pulmonary/Chest: Effort normal and breath sounds normal. No respiratory distress. She has no wheezes. She has no rales. She exhibits no tenderness.    Abdominal: Soft. Bowel sounds are normal. She exhibits no distension and no mass. There is no tenderness. There is no rebound and no guarding.       C-section scar  Musculoskeletal: She exhibits no edema and no tenderness.  Lymphadenopathy:    She has no cervical adenopathy.  Neurological: She is alert and oriented to person, place, and time. She exhibits normal muscle tone. Coordination normal.  Skin: Skin is warm. No rash noted. She is not diaphoretic. No erythema. No pallor.  Psychiatric: She has a normal mood and affect. Her behavior is normal. Judgment and thought content normal.    Data Reviewed I have reviewed her imaging studies and her pathology. I have discussed her case in breast conference this morning. I have consulted Dr. Drue Second and Dr. Antony Blackbird regarding treatment options. I have referred her to a Engineer, petroleum for consultation.   Assessment Invasive lobular carcinoma, left breast, 12:00 position, 3 cm tumor by exam. ER positive. PR positive. HER-2/neu negative. Ki 67 15%.   Patient desires proceeding with left total mastectomy and sentinel node biopsy.   Plan The patient will be scheduled for left total mastectomy with sentinel lymph node mapping and biopsy.   The patient will also be referred for plastic surgery consultation, however she states she is not interested in immediate reconstruction. She is considering delayed reconstruction.   I have discussed the indications and details of her surgery with her. Risks and complications have been outlined, including but not limited to bleeding, infection, skin necrosis, reoperation for positive margins or positive nodes, arm numbness, or swelling, shoulder contracture, cardiac pulmonary and thromboembolic problems. She seems to  understand these issues well. At this time all of her questions were answered. She is in full agreement with this plan.       [name] 12/05/2010, 3:50 PM                Not recorded      Pending        Disp Refills Start End    ceFAZolin (ANCEF) 1 g in dextrose 5 % 50 mL IVPB     12/05/2010      Route:  Intravenous    Class:  Normal            Patient Instructions     You will be scheduled for surgery for your left breast cancer. The surgery that we have decided upon is a left total mastectomy and left axillary sentinel node biopsy.         My office will coordinate the surgery with you. You will also be referred to a plastic surgeon to discuss reconstructive  options for the future.  Mastectomy, With or Without Reconstruction Mastectomy (removal of the breast) is a procedure most commonly used to treat cancer (tumor) of the breast. Different procedures are available for treatment. This depends on the stage of the tumor (abnormal growths). Discuss this with your caregiver, surgeon (a specialist for performing operations such as this), or oncologist (someone specialized in the treatment of cancer). With proper information, you can decide which treatment is best for you. Although the sound of the  word cancer is frightening to all of [place], the new treatments and medications can be a source of reassurance and comfort. If there are things you are worried about, discuss them with your caregiver. He or she can help comfort you and your family. Some of the different procedures for treating breast cancer are: Radical (extensive) mastectomy. This is an operation used to remove the entire breast, the muscles under the breast, and all of the glands (lymph nodes) under the arm. With all of the new treatments available for cancer of the breast, this procedure has become less common.   Modified radical mastectomy. This is a similar operation to the radical mastectomy described above. In the modified radical mastectomy, the muscles of the chest wall are not removed unless one of the lessor muscles is removed. One of the lessor muscles may be removed to allow better removal of the lymph nodes. The axillary lymph nodes are also removed. Rarely, during an axillary node dissection nerves to this area are damaged. Radiation therapy is then often used to the area following this surgery.   A total mastectomy also known as a complete or simple mastectomy. It involves removal of only the breast. The lymph nodes and the muscles are left in place.   In a lumpectomy, the lump is removed from the breast. This is the simplest form of surgical treatment. A sentinel lymph node biopsy may also be done. Additional treatment may be required.  RISKS AND COMPLICATIONS The main problems that follow removal of the breast include: Infection (germs start growing in the wound). This can usually be treated with antibiotics (medications that kill germs).   Lymphedema. This means the arm on the side of the breast that was operated on swells because the lymph (tissue fluid) cannot follow the main channels back into the body. This only occurs when the lymph nodes have had to be removed under the arm.   There may be some areas of numbness to the  upper arm and around the incision (cut by the surgeon) in the breast. This happens because of the cutting of or damage to some of the nerves in the area. This is most often unavoidable.   There may be difficultymoving the arm in a full range of motion (moving in all directions) following surgery. This usually improves with time following use and exercise.       Recurrence of breast cancer may happen with the very best of surgery and follow up treatment. Sometimes small cancer cells that cannot be seen with the naked eye have already spread at the time of surgery. When this happens other treatment is available. This treatment may be radiation, medications or a combination of both.  RECONSTRUCTION Reconstruction of the breast may be done immediately if there is not going to be post-operative radiation. This surgery is done for cosmetic (improve appearance) purposes to improve the physical appearance after the operation. This may be done in two ways: It can be done using a saline filled prosthetic (an artificial breast which  is filled with salt water). Silicone breast implants are now re-approved by the FDA and are being commonly used.   Reconstruction can be done using the body's own muscle/fat/skin.  Your caregiver will discuss your options with you. Depending upon your needs or choice, together you will be able to determine which procedure is best for you. Document Released: 10/02/2000 Document Revised: 09/19/2010 Document Reviewed: 05/26/2007 [facility] Patient Information 2012 [place], [place]. office will coordinate the surgery with you. We are also going to refer you to a plastic surgeon to discuss future options for reconstruction.       Level of Service     PR OFFICE CONSULTATION,LEVEL IV [16109]         All Flowsheet Templates (all recorded)     Encounter Vitals Flowsheet                      All Charges for This Encounter       Code Description Service Date Service Provider  Modifiers Quantity    [phone] PR OFFICE CONSULTATION,LEVEL IV 12/05/2010 Ernestene Mention, MD   1        Other Encounter Related Information     Allergies & Medications         Problem List         History         Patient-Entered Questionnaires     No data filed

## 2010-12-21 ENCOUNTER — Encounter (HOSPITAL_COMMUNITY): Payer: Self-pay | Admitting: Anesthesiology

## 2010-12-21 ENCOUNTER — Other Ambulatory Visit (INDEPENDENT_AMBULATORY_CARE_PROVIDER_SITE_OTHER): Payer: Self-pay | Admitting: General Surgery

## 2010-12-21 ENCOUNTER — Ambulatory Visit (HOSPITAL_COMMUNITY): Payer: BC Managed Care – PPO | Admitting: Anesthesiology

## 2010-12-21 ENCOUNTER — Ambulatory Visit (HOSPITAL_COMMUNITY)
Admission: RE | Admit: 2010-12-21 | Discharge: 2010-12-21 | Disposition: A | Discharge status: Home / Self Care | Payer: BC Managed Care – PPO | Source: Ambulatory Visit | Attending: General Surgery | Admitting: General Surgery

## 2010-12-21 ENCOUNTER — Encounter (HOSPITAL_COMMUNITY)
Admission: RE | Disposition: A | Discharge status: Home / Self Care | Payer: Self-pay | Source: Ambulatory Visit | Attending: General Surgery

## 2010-12-21 ENCOUNTER — Ambulatory Visit (HOSPITAL_COMMUNITY)
Admission: RE | Admit: 2010-12-21 | Discharge: 2010-12-22 | DRG: 258 | Disposition: A | Discharge status: Home / Self Care | Payer: BC Managed Care – PPO | Source: Ambulatory Visit | Attending: General Surgery | Admitting: General Surgery

## 2010-12-21 DIAGNOSIS — C50912 Malignant neoplasm of unspecified site of left female breast: Secondary | ICD-10-CM

## 2010-12-21 DIAGNOSIS — D0590 Unspecified type of carcinoma in situ of unspecified breast: Secondary | ICD-10-CM | POA: Insufficient documentation

## 2010-12-21 DIAGNOSIS — Z01812 Encounter for preprocedural laboratory examination: Secondary | ICD-10-CM | POA: Insufficient documentation

## 2010-12-21 DIAGNOSIS — I059 Rheumatic mitral valve disease, unspecified: Secondary | ICD-10-CM | POA: Insufficient documentation

## 2010-12-21 DIAGNOSIS — C50919 Malignant neoplasm of unspecified site of unspecified female breast: Secondary | ICD-10-CM

## 2010-12-21 HISTORY — PX: MASTECTOMY W/ SENTINEL NODE BIOPSY: SHX2001

## 2010-12-21 SURGERY — MASTECTOMY WITH SENTINEL LYMPH NODE BIOPSY
Anesthesia: General | Site: Breast | Laterality: Left | Wound class: Clean

## 2010-12-21 MED ORDER — PHENYLEPHRINE HCL 10 MG/ML IJ SOLN
INTRAMUSCULAR | Status: DC | PRN
  Administered 2010-12-21 (×2): 80 ug via INTRAVENOUS
  Administered 2010-12-21: 40 ug via INTRAVENOUS
  Administered 2010-12-21: 80 ug via INTRAVENOUS

## 2010-12-21 MED ORDER — HEPARIN SODIUM (PORCINE) 5000 UNIT/ML IJ SOLN
5000.0000 [IU] | Freq: Three times a day (TID) | INTRAMUSCULAR | Status: AC
  Administered 2010-12-22: 5000 [IU] via SUBCUTANEOUS
  Filled 2010-12-21: qty 1

## 2010-12-21 MED ORDER — PROPOFOL 10 MG/ML IV EMUL
INTRAVENOUS | Status: DC | PRN
  Administered 2010-12-21: 120 mg via INTRAVENOUS

## 2010-12-21 MED ORDER — CEFAZOLIN SODIUM 1-5 GM-% IV SOLN: 1.0000 g | Freq: Three times a day (TID) | INTRAVENOUS | Status: DC

## 2010-12-21 MED ORDER — ONDANSETRON HCL 4 MG/2ML IJ SOLN
INTRAMUSCULAR | Status: DC | PRN
  Administered 2010-12-21: 4 mg via INTRAVENOUS

## 2010-12-21 MED ORDER — CEFAZOLIN SODIUM 1-5 GM-% IV SOLN
INTRAVENOUS | Status: DC | PRN
  Administered 2010-12-21: 1 g via INTRAVENOUS

## 2010-12-21 MED ORDER — HYDROCODONE-ACETAMINOPHEN 5-325 MG PO TABS
1.0000 | ORAL_TABLET | ORAL | Status: DC | PRN
  Administered 2010-12-22: 2 via ORAL
  Filled 2010-12-21 (×2): qty 2

## 2010-12-21 MED ORDER — DEXAMETHASONE SODIUM PHOSPHATE 4 MG/ML IJ SOLN
INTRAMUSCULAR | Status: DC | PRN
  Administered 2010-12-21: 8 mg via INTRAVENOUS

## 2010-12-21 MED ORDER — HYDROCODONE-ACETAMINOPHEN 5-325 MG PO TABS: 1.0000 | ORAL_TABLET | ORAL | Status: AC | PRN

## 2010-12-21 MED ORDER — CEFAZOLIN SODIUM 1-5 GM-% IV SOLN
1.0000 g | Freq: Once | INTRAVENOUS | Status: DC
  Filled 2010-12-21 (×2): qty 50

## 2010-12-21 MED ORDER — ROCURONIUM BROMIDE 100 MG/10ML IV SOLN
INTRAVENOUS | Status: DC | PRN
  Administered 2010-12-21: 50 mg via INTRAVENOUS

## 2010-12-21 MED ORDER — GLYCOPYRROLATE 0.2 MG/ML IJ SOLN
INTRAMUSCULAR | Status: DC | PRN
  Administered 2010-12-21: .6 mg via INTRAVENOUS

## 2010-12-21 MED ORDER — CEFAZOLIN SODIUM 1-5 GM-% IV SOLN
1.0000 g | Freq: Once | INTRAVENOUS | Status: AC
  Administered 2010-12-21: 1 g via INTRAVENOUS
  Filled 2010-12-21: qty 50

## 2010-12-21 MED ORDER — ONDANSETRON HCL 4 MG/2ML IJ SOLN: 4.0000 mg | Freq: Once | INTRAMUSCULAR | Status: DC | PRN

## 2010-12-21 MED ORDER — CEFAZOLIN SODIUM 1-5 GM-% IV SOLN
1.0000 g | Freq: Once | INTRAVENOUS | Status: AC
  Administered 2010-12-21: 1 g via INTRAVENOUS
  Filled 2010-12-21 (×2): qty 50

## 2010-12-21 MED ORDER — HEPARIN SODIUM (PORCINE) 5000 UNIT/ML IJ SOLN
5000.0000 [IU] | Freq: Three times a day (TID) | INTRAMUSCULAR | Status: DC
  Filled 2010-12-21 (×4): qty 1

## 2010-12-21 MED ORDER — HYDROMORPHONE HCL PF 1 MG/ML IJ SOLN
0.2500 mg | INTRAMUSCULAR | Status: DC | PRN
  Administered 2010-12-21 (×2): 0.5 mg via INTRAVENOUS

## 2010-12-21 MED ORDER — NEOSTIGMINE METHYLSULFATE 1 MG/ML IJ SOLN
INTRAMUSCULAR | Status: DC | PRN
  Administered 2010-12-21: 4 mg via INTRAVENOUS

## 2010-12-21 MED ORDER — MORPHINE SULFATE 2 MG/ML IJ SOLN
2.0000 mg | INTRAMUSCULAR | Status: DC | PRN
  Administered 2010-12-21 (×2): 2 mg via INTRAVENOUS
  Filled 2010-12-21 (×2): qty 1

## 2010-12-21 MED ORDER — HEPARIN SODIUM (PORCINE) 5000 UNIT/ML IJ SOLN: 5000.0000 [IU] | Freq: Once | INTRAMUSCULAR | Status: AC

## 2010-12-21 MED ORDER — HEPARIN SODIUM (PORCINE) 5000 UNIT/ML IJ SOLN
5000.0000 [IU] | INTRAMUSCULAR | Status: DC
  Filled 2010-12-21: qty 1

## 2010-12-21 MED ORDER — MIDAZOLAM HCL 5 MG/5ML IJ SOLN
INTRAMUSCULAR | Status: DC | PRN
  Administered 2010-12-21: 2 mg via INTRAVENOUS

## 2010-12-21 MED ORDER — SODIUM CHLORIDE 0.9 % IJ SOLN
INTRAMUSCULAR | Status: DC | PRN
  Administered 2010-12-21: 08:00:00 via INTRADERMAL

## 2010-12-21 MED ORDER — FENTANYL CITRATE 0.05 MG/ML IJ SOLN
INTRAMUSCULAR | Status: DC | PRN
  Administered 2010-12-21 (×2): 100 ug via INTRAVENOUS
  Administered 2010-12-21: 50 ug via INTRAVENOUS

## 2010-12-21 MED ORDER — KCL IN DEXTROSE-NACL 20-5-0.45 MEQ/L-%-% IV SOLN
INTRAVENOUS | Status: DC
  Administered 2010-12-21 (×2): via INTRAVENOUS
  Filled 2010-12-21 (×4): qty 1000

## 2010-12-21 MED ORDER — LACTATED RINGERS IV SOLN
INTRAVENOUS | Status: DC | PRN
  Administered 2010-12-21 (×2): via INTRAVENOUS

## 2010-12-21 MED ORDER — MEPERIDINE HCL 25 MG/ML IJ SOLN: 6.2500 mg | INTRAMUSCULAR | Status: DC | PRN

## 2010-12-21 MED ORDER — TECHNETIUM TC 99M SULFUR COLLOID FILTERED
1.0000 | Freq: Once | INTRAVENOUS | Status: AC | PRN
  Administered 2010-12-21: 1 via INTRADERMAL

## 2010-12-21 MED ORDER — INFLUENZA VIRUS VACC SPLIT PF IM SUSP
0.5000 mL | INTRAMUSCULAR | Status: AC
  Administered 2010-12-22: 0.5 mL via INTRAMUSCULAR
  Filled 2010-12-21: qty 0.5

## 2010-12-21 MED ORDER — CEFAZOLIN SODIUM 1-5 GM-% IV SOLN: 1.0000 g | INTRAVENOUS | Status: DC

## 2010-12-21 MED ORDER — ONDANSETRON HCL 4 MG/2ML IJ SOLN
4.0000 mg | Freq: Four times a day (QID) | INTRAMUSCULAR | Status: DC | PRN
  Administered 2010-12-21: 4 mg via INTRAVENOUS
  Filled 2010-12-21: qty 2

## 2010-12-21 SURGICAL SUPPLY — 52 items
ADH SKN CLS APL DERMABOND .7 (GAUZE/BANDAGES/DRESSINGS) ×1
APPLIER CLIP 9.375 MED OPEN (MISCELLANEOUS)
APR CLP MED 9.3 20 MLT OPN (MISCELLANEOUS)
BINDER BREAST LRG (GAUZE/BANDAGES/DRESSINGS) ×2 IMPLANT
BINDER BREAST MEDIUM (GAUZE/BANDAGES/DRESSINGS) ×2 IMPLANT
BINDER BREAST XLRG (GAUZE/BANDAGES/DRESSINGS) IMPLANT
CANISTER SUCTION 2500CC (MISCELLANEOUS) ×2 IMPLANT
CHLORAPREP W/TINT 26ML (MISCELLANEOUS) ×2 IMPLANT
CLIP APPLIE 9.375 MED OPEN (MISCELLANEOUS) IMPLANT
CLOSURE STERI STRIP 1/2 X4 (GAUZE/BANDAGES/DRESSINGS) ×2 IMPLANT
CLOTH BEACON ORANGE TIMEOUT ST (SAFETY) ×2 IMPLANT
CONT SPEC 4OZ CLIKSEAL STRL BL (MISCELLANEOUS) ×2 IMPLANT
COVER PROBE W GEL 5X96 (DRAPES) ×2 IMPLANT
COVER SURGICAL LIGHT HANDLE (MISCELLANEOUS) ×2 IMPLANT
DERMABOND ADVANCED (GAUZE/BANDAGES/DRESSINGS) ×1
DERMABOND ADVANCED .7 DNX12 (GAUZE/BANDAGES/DRESSINGS) ×1 IMPLANT
DRAIN CHANNEL 19F RND (DRAIN) ×2 IMPLANT
DRAPE LAPAROSCOPIC ABDOMINAL (DRAPES) ×2 IMPLANT
DRSG ADAPTIC 3X8 NADH LF (GAUZE/BANDAGES/DRESSINGS) ×2 IMPLANT
DRSG PAD ABDOMINAL 8X10 ST (GAUZE/BANDAGES/DRESSINGS) ×2 IMPLANT
ELECT BLADE 4.0 EZ CLEAN MEGAD (MISCELLANEOUS) ×2
ELECT CAUTERY BLADE 6.4 (BLADE) ×2 IMPLANT
ELECT REM PT RETURN 9FT ADLT (ELECTROSURGICAL) ×2
ELECTRODE BLDE 4.0 EZ CLN MEGD (MISCELLANEOUS) ×1 IMPLANT
ELECTRODE REM PT RTRN 9FT ADLT (ELECTROSURGICAL) ×1 IMPLANT
EVACUATOR SILICONE 100CC (DRAIN) ×2 IMPLANT
GLOVE BIO SURGEON STRL SZ7.5 (GLOVE) ×4 IMPLANT
GLOVE BIOGEL PI IND STRL 7.5 (GLOVE) ×1 IMPLANT
GLOVE BIOGEL PI INDICATOR 7.5 (GLOVE) ×1
GLOVE ECLIPSE 6.5 STRL STRAW (GLOVE) ×2 IMPLANT
GLOVE EUDERMIC 7 POWDERFREE (GLOVE) ×2 IMPLANT
GOWN PREVENTION PLUS XLARGE (GOWN DISPOSABLE) ×4 IMPLANT
GOWN STRL NON-REIN LRG LVL3 (GOWN DISPOSABLE) ×2 IMPLANT
KIT BASIN OR (CUSTOM PROCEDURE TRAY) ×2 IMPLANT
KIT ROOM TURNOVER OR (KITS) ×2 IMPLANT
NEEDLE 18GX1X1/2 (RX/OR ONLY) (NEEDLE) ×2 IMPLANT
NEEDLE HYPO 25GX1X1/2 BEV (NEEDLE) ×4 IMPLANT
NS IRRIG 1000ML POUR BTL (IV SOLUTION) ×2 IMPLANT
PACK GENERAL/GYN (CUSTOM PROCEDURE TRAY) ×2 IMPLANT
PAD ARMBOARD 7.5X6 YLW CONV (MISCELLANEOUS) ×4 IMPLANT
PEN SKIN MARKING BROAD (MISCELLANEOUS) IMPLANT
SPECIMEN JAR LARGE (MISCELLANEOUS) ×2 IMPLANT
SPONGE GAUZE 4X4 STERILE 39 (GAUZE/BANDAGES/DRESSINGS) ×2 IMPLANT
STAPLER VISISTAT 35W (STAPLE) ×2 IMPLANT
SUT ETHILON 3 0 FSL (SUTURE) ×2 IMPLANT
SUT MNCRL AB 4-0 PS2 18 (SUTURE) ×2 IMPLANT
SUT SILK 2 0 FS (SUTURE) ×2 IMPLANT
SUT VIC AB 3-0 SH 18 (SUTURE) ×2 IMPLANT
SYR CONTROL 10ML LL (SYRINGE) ×4 IMPLANT
TOWEL OR 17X24 6PK STRL BLUE (TOWEL DISPOSABLE) ×2 IMPLANT
TOWEL OR 17X26 10 PK STRL BLUE (TOWEL DISPOSABLE) ×2 IMPLANT
WATER STERILE IRR 1000ML POUR (IV SOLUTION) IMPLANT

## 2010-12-21 NOTE — Anesthesia Postprocedure Evaluation (Signed)
  Anesthesia Post-op Note  Patient: Daisy Thomas  Procedure(s) Performed:  MASTECTOMY WITH SENTINEL LYMPH NODE BIOPSY - Left total mastectomy & sentinel lymph node biopsy  Patient Location: PACU  Anesthesia Type: General  Level of Consciousness: awake  Airway and Oxygen Therapy: Patient connected to nasal cannula oxygen  Post-op Pain: none  Post-op Assessment: Post-op Vital signs reviewed  Post-op Vital Signs: stable  Complications: No apparent anesthesia complications

## 2010-12-21 NOTE — Preoperative (Signed)
Beta Blockers   Reason not to administer Beta Blockers:Not Applicable 

## 2010-12-21 NOTE — Transfer of Care (Signed)
Immediate Anesthesia Transfer of Care Note  Patient: Daisy Thomas  Procedure(s) Performed:  MASTECTOMY WITH SENTINEL LYMPH NODE BIOPSY - Left total mastectomy & sentinel lymph node biopsy  Patient Location: PACU  Anesthesia Type: General  Level of Consciousness: awake, alert  and oriented  Airway & Oxygen Therapy: Patient Spontanous Breathing and Patient connected to nasal cannula oxygen  Post-op Assessment: Report given to PACU RN and Post -op Vital signs reviewed and stable  Post vital signs: Reviewed and stable  Complications: No apparent anesthesia complications

## 2010-12-21 NOTE — Interval H&P Note (Signed)
History and Physical Interval Note:  12/21/2010 7:06 AM  Daisy Thomas  has presented today for surgery, with the diagnosis of breast cancer  The various methods of treatment have been discussed with the patient and family. After consideration of risks, benefits and other options for treatment, the patient has consented to  Procedure(s): LEFT MASTECTOMY WITH SENTINEL LYMPH NODE BIOPSY as a surgical intervention .  The patients' history has been reviewed today, patient examined today, there is no change in her status, she is stable for surgery.  I have reviewed the patients' chart and labs.  Questions were answered to the patient's satisfaction.  She is in agreement with the surgical plan.l   Haani Bakula M 12/21/2010 \\7 :08 AM

## 2010-12-21 NOTE — Anesthesia Preprocedure Evaluation (Addendum)
Anesthesia Evaluation  Patient identified by MRN, date of birth, ID band Patient awake    History of Anesthesia Complications (+) PONV  Airway Mallampati: II TM Distance: <3 FB Neck ROM: full    Dental No notable dental hx. (+) Teeth Intact and Dental Advidsory Given   Pulmonary    Pulmonary exam normal       Cardiovascular + Valvular Problems/Murmurs MVP     Neuro/Psych Negative Neurological ROS     GI/Hepatic negative GI ROS, Neg liver ROS,   Endo/Other  Negative Endocrine ROS  Renal/GU negative Renal ROS  Genitourinary negative   Musculoskeletal   Abdominal   Peds  Hematology negative hematology ROS (+)   Anesthesia Other Findings   Reproductive/Obstetrics negative OB ROS                           Anesthesia Physical Anesthesia Plan  ASA: II  Anesthesia Plan: General   Post-op Pain Management:    Induction: Intravenous  Airway Management Planned: Oral ETT  Additional Equipment:   Intra-op Plan:   Post-operative Plan:   Informed Consent: I have reviewed the patients History and Physical, chart, labs and discussed the procedure including the risks, benefits and alternatives for the proposed anesthesia with the patient or authorized representative who has indicated his/her understanding and acceptance.   Dental Advisory Given  Plan Discussed with: CRNA and Surgeon  Anesthesia Plan Comments:        Anesthesia Quick Evaluation

## 2010-12-21 NOTE — Op Note (Signed)
Patient Name:           Daisy Thomas   Date of Surgery:        12/21/2010  Pre op Diagnosis:      Invasive lobular carcinoma, left breast, 12:00 position, receptor positive, HER-2-negative, clinical stage T2 in 0  Post op Diagnosis:    same  Procedure:                 Inject blue dye left breast, left total mastectomy, left axillary sentinel lymph node mapping and biopsy  Surgeon:                     Angelia Mould. Derrell Lolling, M.D., FACS  Assistant:                      Magnus Ivan, RN, RNFA  Operative Indications:   This is a 55 year old Caucasian female who recently felt a lump in the superior aspect of her left breast. Mammograms and ultrasound showed a mass at the 12:00 position. Physical exam of the breast reveals a 3 cm mass in the 12:00 position of the left breast which is mobile. Does not invade the skin or the chest wall. There is no palpable adenopathy. Image guided biopsy of the mass revealed invasive lobular carcinoma, receptor positive, HER-2-negative, Ki 67 15%. There was also a second area of calcification in the 9:00 position that was not biopsied. MRI shows a 2.1 cm mass in the 12:00 region of the left breast and also identifies a cluster of calcifications in the medial left breast which was thought to be suspicious. The patient was not interested in neoadjuvant chemotherapy. She was not interested in breast conservation. She did have a consultation with plastic surgery but wanted to delay reconstruction. It is her strong desire to simply proceed with left mastectomy and sentinel node biopsy.  Operative Findings:       The tumor in the 12:00 position was mobile and felt to be almost 3 cm in size. I was able to make a transverse incision and I was able to get the skin together but did require some undermining because the closure was tight. The skin flaps looked healthy at the completion of the case. We found four sentinel lymph nodes but none of them appeared to be pathologically  enlarged.  Procedure in Detail:          The patient was brought to the holding area at [facility]. She was identified and the laterality was marked. The left breast was injected with radionuclide by the nuclear medicine technician. The patient was taken to the operating room where a general anesthetic was induced. Intravenous antibotics were given. Surgical time out was held. Following alcohol prep I injected 5 cc of blue dye into the left breast, subareolar area. This was 2 cc of methylene blue mixed with 3 cc of saline. The left breast was massaged for 5 minutes.  The left breast and shoulder and chest wall and axilla were then prepped and draped in a sterile fashion. Using a marking pen I marked a transverse elliptical incision. The superior incision had to go fairly high to get around the tumor. The incision was made. Skin flaps were raised superiorly to just below the clavicle, medially to the parasternal area, inferiorly to the anterior rectus sheath and laterally to the latissimus dorsi muscle. The breast was dissected cleanly off of the pectoralis major and minor muscles leaving the muscles intact. Using  the neoprobe we dissected up into the axilla identified four sentinel lymph nodes. All of these have strong radioactivity and 3 of the 4 had blue dye. After these 4 lymph nodes were removed there was very little radioactivity. These were sent as separate specimens. We continued the dissection and detached the breast from the chest wall. I marked the lateral skin margin with a silk suture. The specimen was sent to pathology. Hemostasis was excellent and achieved with electrocautery. We placed two-19 [place] Blake drains, one up into the axillary area and one across the skin flaps. These were brought out through separate stab incisions inferolaterally, sutured to the skin with nylon sutures and connected to suction bulbs. The skin was closed in 2 layers. Subcutaneous tissue and deep dermis was closed  with multiple interrupted sutures of 3-0 Vicryl and the skin closed with running subcuticular sutures of 4-0 Monocryl and Steri-Strips. The skin looked pink and healthy at the completion of the case. Clean bandages and a breast binder were placed.  The patient tolerated the procedure well and was taken to recovery room in stable condition. EBL was 50-75 cc. complications none. Counts were correct.     Angelia Mould. Derrell Lolling, M.D., FACS General and Minimally Invasive Surgery Breast and Colorectal Surgery  12/21/2010 9:10 AM

## 2010-12-22 NOTE — Discharge Summary (Signed)
  Patient ID: Daisy Thomas [id number] 55 y.o. 06-18-1955  12/21/2010  Discharge date and time: No discharge date for patient encounter.  Admitting Physician: Ernestene Mention  Discharge Physician: Ernestene Mention  Admission Diagnoses: breast cancer  Discharge Diagnoses: invasive lobular carcinoma, left breast, clinical stage T2, N0, receptor positive, HER-2/neu negative. Final pathology pending  Operations: Procedure(s): MASTECTOMY WITH SENTINEL LYMPH NODE BIOPSY  Admission Condition: good  Discharged Condition: good  Indication for Admission: this is a 55 year old Caucasian female who recently felt a lump in the superior aspect of her left breast. Imaging studies and biopsies confirmed that she had a 3 cm mass at the 12:00 position which on biopsy revealed invasive lobular carcinoma, strongly receptor positive, HER-2/neu negative, Ki 67 15%. She also had a separate area of calcifications at the 9:00 position which were never biopsied. The patient was seen in the breast multidisciplinary clinic. It was her strong desire and decision to proceed with left mastectomy and left sentinel node biopsy. She was seen by Dr. Etter Sjogren preop but has declined immediate reconstruction but is considering delayed reconstruction. She was brought to the hospital electively for left mastectomy.  Hospital Course: on the day of admission the patient underwent left total mastectomy and sentinel node  biopsy. The surgery was uneventful. We had to take more skin than usual superiorly because of the superior location of the tumor, but we were able to get the skin together without any ischemic change. Postoperatively she did well and was ready to go home on postop day 1. At that time her skin flaps were examined were pink and warm without any evidence of ischemia. The drainage was light. She was instructed in wound care and drain care as were her family friends. She was given a prescription for Vicodin for  pain issues given an appointment to return to see me in the office in one week.  Consults: none  Significant Diagnostic Studies: none  Treatments: surgery: Left total mastectomy with SLN biopsy  Disposition: Home  Patient Instructions:   Daisy, Thomas  Home Medication Instructions MWN:[id number]   Printed on:12/22/10 0516  Medication Information                    HYDROcodone-acetaminophen (NORCO) 5-325 MG per tablet Take 1-2 tablets by mouth every 4 (four) hours as needed for pain.             Activity: no lifting, driving, or strenuous exercise for 2 weeks Diet: regular diet Wound Care: as directed  Follow-up:  With Dr. Derrell Lolling in 1 week.  Signed: Angelia Mould. Derrell Lolling, M.D., FACS General and minimally invasive surgery Breast and Colorectal Surgery  12/22/2010, 5:16 AM

## 2010-12-22 NOTE — Progress Notes (Signed)
1 Day Post-Op  Subjective: Patient is alert and oriented and feels well. Minimal pain. No nausea or vomiting. Drainage is light to moderate.  Objective: Vital signs in last 24 hours: Temp:  [97.2 F (36.2 C)-98.5 F (36.9 C)] 98.3 F (36.8 C) (12/01 0231) Pulse Rate:  [51-83] 66  (12/01 0231) Resp:  [10-31] 18  (12/01 0231) BP: (100-127)/(45-87) 105/49 mmHg (12/01 0231) SpO2:  [85 %-100 %] 98 % (12/01 0231) Weight:  [145 lb 1 oz (65.8 kg)] 145 lb 1 oz (65.8 kg) (11/30 2100) Last BM Date: 12/21/10  Intake/Output from previous day: 11/30 0701 - 12/01 0700 In: 3230 [P.O.:480; I.V.:2750] Out: 1795 [Urine:1700; Drains:45; Blood:50] Intake/Output this shift: Total I/O In: 830 [I.V.:830] Out: -   Resp: clear to auscultation bilaterally Breasts: left mastectomy wound is clean and dry. Skin flaps are pink and viable without any evidence of ischemia. No fluid collection or hematoma. Drainage is serosanguineous. 45 cc total last 12 hours.  Lab Results:  No results found for this or any previous visit (from the past 24 hour(s)).   Studies/Results: @RISRSLT24 @     . ceFAZolin (ANCEF) IV  1 g Intravenous Once  . ceFAZolin (ANCEF) IV  1 g Intravenous Once  . heparin subcutaneous  5,000 Units Subcutaneous Q8H  . heparin  5,000 Units Subcutaneous Q8H  . influenza  inactive virus vaccine  0.5 mL Intramuscular Tomorrow-1000  . DISCONTD: ceFAZolin (ANCEF) IV  1 g Intravenous 60 min Pre-Op  . DISCONTD: ceFAZolin (ANCEF) IV  1 g Intravenous 60 min Pre-Op  . DISCONTD: ceFAZolin (ANCEF) IV  1 g Intravenous Q8H  . DISCONTD: ceFAZolin (ANCEF) IV  1 g Intravenous Once  . DISCONTD: heparin subcutaneous  5,000 Units Subcutaneous On Call to OR     Assessment/Plan: s/p Procedure(s): MASTECTOMY WITH SENTINEL LYMPH NODE BIOPSY Patient is stable and ready for discharge.  We'll discharge home today.  Prescription for Vicodin given.  Instructions for wound care and drain care given.  Return  to see me in one week for wound and drain check. Patient Active Hospital Problem List: No active hospital problems.   LOS: 1 day    Siddharth Babington M 12/22/2010  . .prob

## 2010-12-22 NOTE — Progress Notes (Signed)
Patient D/C around 1000 with brother and sister in law. Patient given all D/C instructions and perscription. Patient denied any questions. Flu shot given before patient left. Patient showed how to empty drains JP. Patient given 2 NORCO this morning at 0935 with good relief.

## 2010-12-22 NOTE — Plan of Care (Signed)
Problem: Consults Goal: Diagnosis-Modified Radical Mastectomy Outcome: Completed/Met Date Met:  12/22/10 Left Mastectomy

## 2010-12-24 ENCOUNTER — Encounter (HOSPITAL_COMMUNITY): Payer: Self-pay | Admitting: General Surgery

## 2010-12-25 ENCOUNTER — Telehealth (INDEPENDENT_AMBULATORY_CARE_PROVIDER_SITE_OTHER): Payer: Self-pay

## 2010-12-25 NOTE — Telephone Encounter (Signed)
LMOM for pt to call for path result.

## 2010-12-26 ENCOUNTER — Telehealth (INDEPENDENT_AMBULATORY_CARE_PROVIDER_SITE_OTHER): Payer: Self-pay

## 2010-12-26 NOTE — Telephone Encounter (Signed)
Pt called,given path result. Has appt 12-21 with oncology.

## 2010-12-27 ENCOUNTER — Encounter: Payer: Self-pay | Admitting: *Deleted

## 2010-12-27 ENCOUNTER — Ambulatory Visit (INDEPENDENT_AMBULATORY_CARE_PROVIDER_SITE_OTHER): Payer: BC Managed Care – PPO | Admitting: General Surgery

## 2010-12-27 ENCOUNTER — Encounter (INDEPENDENT_AMBULATORY_CARE_PROVIDER_SITE_OTHER): Payer: Self-pay | Admitting: General Surgery

## 2010-12-27 VITALS — BP 122/84 | HR 64 | Temp 96.8°F | Resp 14 | Ht 71.0 in | Wt 146.2 lb

## 2010-12-27 DIAGNOSIS — Z9889 Other specified postprocedural states: Secondary | ICD-10-CM

## 2010-12-27 NOTE — Progress Notes (Signed)
Ordered Oncotype Dx w/ [facility].  Faxed request to Pathology.  Faxed PAC to [employer].

## 2010-12-27 NOTE — Progress Notes (Signed)
Subjective:     Patient ID: Daisy Thomas, female   DOB: 11/17/1955, 55 y.o.   MRN: [id number]  HPI This patient underwent left total mastectomy and sentinel mnde biopsy on December 21, 2010. She is doing reasonably well. Not much pain. No major problems. The drainage from the skin flaps is less than 10 cc a day.  The drainage from the axillary drain is greater than 20 cc per day. She has an appointment to see Dr. Welton Flakes on December 21. She has already seen physical therapy.  Review of Systems     Objective:   Physical Exam The patient looks good. She is in good spirits. One of her friends with her. The dressing was removed and the skin flaps looked great. There are no residual fluid collections. No sign of infection. I removed the drain that was across the skin flaps and left the drain in the axilla. The wound was redressed.   Assessment:     Invasive lobular carcinoma left breast, multifocal, pathologic stage T2, N0. Receptor positive, HER-2-negative.  Doing well in the early  postop period following left total mastectomy and sentinel lung biopsy.    Plan:     Diet and activities and shoulder range of motion discussed.  Return to see me in 5-7 days, hopefully we can remove the other drain at that time.  See Dr. Drue Second on December 21

## 2010-12-27 NOTE — Progress Notes (Signed)
Utilization review completed. Suits, Teri Diane12/06/2010

## 2010-12-27 NOTE — Patient Instructions (Signed)
Your left mastectomy wound is healing very nicely. We removed one of the 2 drains, and hopefully we'll remove the other one next week. Keep her follow up with Dr. Park Breed. I will see you in one week.

## 2011-01-03 ENCOUNTER — Ambulatory Visit (INDEPENDENT_AMBULATORY_CARE_PROVIDER_SITE_OTHER): Payer: BC Managed Care – PPO | Admitting: General Surgery

## 2011-01-03 ENCOUNTER — Encounter (INDEPENDENT_AMBULATORY_CARE_PROVIDER_SITE_OTHER): Payer: Self-pay | Admitting: General Surgery

## 2011-01-03 VITALS — BP 146/80 | HR 72 | Temp 97.6°F | Resp 16 | Ht 71.0 in | Wt 147.2 lb

## 2011-01-03 DIAGNOSIS — Z9889 Other specified postprocedural states: Secondary | ICD-10-CM

## 2011-01-03 NOTE — Progress Notes (Signed)
Subjective:     Patient ID: Daisy Thomas, female   DOB: 29-Jan-1955, 55 y.o.   MRN: [id number]  HPI  This patient underwent left total mastectomy and sentinel node biopsy on December 21, 2010. Pathology report showed invasive lobular carcinoma of the left breast, pathologic stage T2, N0, ER positive, HER-2 negative.  She returns for wound check and drain removal. The remaining axillary drain is draining almost nothing. She feels well and does not have any pain. She has an appointment see Dr. Konrad Dolores next week. She does not have an appointment for followup with physical therapy yet. Review of Systems     Objective:   Physical Exam Patient looks well. A family member is here with her.  Left mastectomy wound looks great. Skin flaps showed no signs of infection or necrosis. I removed the single nylon suture in the center of the mastectomy wound. I removed the other drain. The wounds were redressed.. Range of motion left shoulder is about 80% of normal.    Assessment:     Invasive lobular carcinoma left breast, pathologic stage TII N0, receptor positive, HER-2 negative.  Recovering uneventfully following left mastectomy and sentinel node biopsy.    Plan:     She was given an instruction sheet on left shoulder exercises. She was also referred back to physical therapy for a formal physical therapy session.  Okay to shower starting tomorrow.  Wound care discussed  See Dr. Drue Second next week.   See me in 6 weeks.

## 2011-01-03 NOTE — Patient Instructions (Signed)
We removed the last drain today. Your wounds are healing very nicely. You have been given an instruction sheet for left shoulder exercises and you may begin those tomorrow. You may begin showering tomorrow. You will be referred back to physical therapy. Keep your appointment with Dr. Drue Second next week. I will see you back in 6 weeks.

## 2011-01-10 ENCOUNTER — Encounter: Payer: Self-pay | Admitting: *Deleted

## 2011-01-10 NOTE — Progress Notes (Signed)
Received Oncotype Dx results of 6.  Gave copy to MD.

## 2011-01-11 ENCOUNTER — Other Ambulatory Visit (HOSPITAL_BASED_OUTPATIENT_CLINIC_OR_DEPARTMENT_OTHER): Payer: BC Managed Care – PPO | Admitting: Lab

## 2011-01-11 ENCOUNTER — Ambulatory Visit (HOSPITAL_BASED_OUTPATIENT_CLINIC_OR_DEPARTMENT_OTHER): Payer: BC Managed Care – PPO | Admitting: Oncology

## 2011-01-11 ENCOUNTER — Telehealth: Payer: Self-pay | Admitting: Oncology

## 2011-01-11 VITALS — BP 135/82 | HR 65 | Temp 98.2°F | Ht 71.0 in | Wt 143.8 lb

## 2011-01-11 DIAGNOSIS — Z170 Estrogen receptor positive status [ER+]: Secondary | ICD-10-CM

## 2011-01-11 DIAGNOSIS — C50919 Malignant neoplasm of unspecified site of unspecified female breast: Secondary | ICD-10-CM

## 2011-01-11 DIAGNOSIS — Z9010 Acquired absence of unspecified breast and nipple: Secondary | ICD-10-CM

## 2011-01-11 LAB — CBC WITH DIFFERENTIAL/PLATELET
Basophils Absolute: 0.1 10*3/uL (ref 0.0–0.1)
EOS%: 1.2 % (ref 0.0–7.0)
HCT: 41.4 % (ref 34.8–46.6)
HGB: 14.1 g/dL (ref 11.6–15.9)
MCH: 30.1 pg (ref 25.1–34.0)
MONO#: 0.4 10*3/uL (ref 0.1–0.9)
NEUT%: 62.6 % (ref 38.4–76.8)
lymph#: 1.3 10*3/uL (ref 0.9–3.3)

## 2011-01-11 MED ORDER — ANASTROZOLE 1 MG PO TABS: 1.0000 mg | ORAL_TABLET | Freq: Every day | ORAL | Status: DC

## 2011-01-11 NOTE — Patient Instructions (Signed)

## 2011-01-11 NOTE — Telephone Encounter (Signed)
Gv pt appt for [id number].  Scheduled pt for bone density for 02/11/2011 @ [facility]

## 2011-01-16 NOTE — Progress Notes (Signed)
OFFICE PROGRESS NOTE   CC Dr. Claud Kelp  Dr. Antony Blackbird  DIAGNOSIS: 55 year old female with T2 N0 ER positive HER-2/neu negative invasive lobular carcinoma of the left breast status post left total mastectomy with sentinel node biopsy on 12/21/2010.  PRIOR THERAPY:  #1 patient was originally seen in the multidisciplinary breast clinic on 12/05/2010 for discussion of her new invasive lobular carcinoma of the left breast. The tumor was at the 12:00 position measuring about 3 cm. The biopsy showed an invasive lobular carcinoma ER positive PR positive proliferation marker 15% and HER-2/neu negative.  #2 she is now left total mastectomy with sentinel node biopsy on 12/21/2010. The final pathology revealed an invasive lobular carcinoma pathologic stage TII N0 ER positive PR positive HER-2/neu negative. Proliferation marker 15%.  #3 patient had an Oncotype DX score performed her breast cancer recurrence score was 6 giving her an average rate of distant recurrence about 5% with antiestrogen therapy.  CURRENT THERAPY:patient will now begin antiestrogen therapy consisting of Arimidex 1 mg daily  INTERVAL HISTORY: Daisy Thomas 55 y.o. female returns for followup visit today. Overall she is doing well she is very pleased with her report especially the pathology report. She denies any fevers chills night sweats headaches shortness of breath chest pains palpitations she has no myalgias or arthralgias. Her mastectomy site is healing quite well. Remainder of the 10 point review of systems is negative. She and I did discuss her Oncotype DX score and report in great detail.  MEDICAL HISTORY: Past Medical History  Diagnosis Date  . PONV (postoperative nausea and vomiting)   . Heart murmur     NO TX, OR MEDS  . Breast cancer 12/05/2010    left    ALLERGIES:  is allergic to other.  MEDICATIONS:  Current Outpatient Prescriptions  Medication Sig Dispense Refill  . anastrozole (ARIMIDEX)  1 MG tablet Take 1 tablet (1 mg total) by mouth daily.  30 tablet  4  . HYDROcodone-acetaminophen (NORCO) 5-325 MG per tablet Ad lib.       No current facility-administered medications for this visit.   Facility-Administered Medications Ordered in Other Visits  Medication Dose Route Frequency Provider Last Rate Last Dose  . heparin injection 5,000 Units  5,000 Units Subcutaneous Once Ernestene Mention, MD        SURGICAL HISTORY:  Past Surgical History  Procedure Date  . Myomectomy 1986    ovarian fibroid  . Cesarean section 1990  . Mastectomy w/ sentinel node biopsy 12/21/2010    Procedure: MASTECTOMY WITH SENTINEL LYMPH NODE BIOPSY;  Surgeon: Ernestene Mention, MD;  Location: [facility] OR;  Service: General;  Laterality: Left;  Left total mastectomy & sentinel lymph node biopsy  . Breast lumpectomy     left breast   . Myomectomy   . Breast lumpectomy     left breast, benign- Dr. Samuella Cota    REVIEW OF SYSTEMS:  Pertinent items are noted in HPI.   PHYSICAL EXAMINATION: General appearance: alert, cooperative and appears stated age Head: Normocephalic, without obvious abnormality, atraumatic Neck: no adenopathy, no carotid bruit, no JVD, supple, symmetrical, trachea midline and thyroid not enlarged, symmetric, no tenderness/mass/nodules Lymph nodes: Cervical, supraclavicular, and axillary nodes normal. Resp: clear to auscultation bilaterally and normal percussion bilaterally Back: symmetric, no curvature. ROM normal. No CVA tenderness. Cardio: regular rate and rhythm, S1, S2 normal, no murmur, click, rub or gallop GI: soft, non-tender; bowel sounds normal; no masses,  no organomegaly Extremities: extremities normal, atraumatic, no  cyanosis or edema Neurologic: Alert and oriented X 3, normal strength and tone. Normal symmetric reflexes. Normal coordination and gait  ECOG PERFORMANCE STATUS: 0 - Asymptomatic  Blood pressure 135/82, pulse 65, temperature 98.2 F (36.8 C), height 5\' 11"  (1.803  m), weight 143 lb 12.8 oz (65.227 kg).  LABORATORY DATA: Lab Results  Component Value Date   WBC 5.1 01/11/2011   HGB 14.1 01/11/2011   HCT 41.4 01/11/2011   MCV 88.5 01/11/2011   PLT 222 01/11/2011      Chemistry      Component Value Date/Time   NA 143 12/18/2010 1532   K 4.0 12/18/2010 1532   CL 105 12/18/2010 1532   CO2 29 12/18/2010 1532   BUN 6 12/18/2010 1532   CREATININE 0.78 12/18/2010 1532      Component Value Date/Time   CALCIUM 9.7 12/18/2010 1532   ALKPHOS 61 12/18/2010 1532   AST 12 12/18/2010 1532   ALT 13 12/18/2010 1532   BILITOT 0.3 12/18/2010 1532       RADIOGRAPHIC STUDIES:  Nm Sentinel Node Inj-no Rpt (breast)  12/21/2010  CLINICAL DATA: LT BREAST INJECTION   Sulfur colloid was injected intradermally by the nuclear medicine  technologist for breast cancer sentinel node localization.      ASSESSMENT: 55 year old female with stage II ( T2 N0) invasive lobular carcinoma of the left breast status post mastectomy of the left breast. Postoperatively patient is doing extremely well. She had an Oncotype DX performed and the score is low giving her a 5% 5 year risk of rib relapse with antiestrogen therapy this is quite acceptable.   PLAN: recommendation at this time is to proceed with an antiestrogen therapy consisting of Arimidex 1 mg daily. We will also get a bone density scan and we will check a vitamin D level on her next visit. A she will not require radiation therapy as she has had a mastectomy. She will be seen back in 3 months time for followup. However she knows to call me sooner if need arises and I certainly will see her sooner. Risks and benefits of Arimidex were discussed with the patient and literature was given to her as well.   All questions were answered. The patient knows to call the clinic with any problems, questions or concerns. We can certainly see the patient much sooner if necessary.  I spent 20 minutes counseling the patient face to  face. The total time spent in the appointment was 30 minutes.    Drue Second, MD Medical/Oncology [facility] [phone] (beeper) [phone] (Office)  01/16/2011, 12:22 PM

## 2011-02-11 ENCOUNTER — Other Ambulatory Visit: Payer: BC Managed Care – PPO

## 2011-02-14 ENCOUNTER — Ambulatory Visit (INDEPENDENT_AMBULATORY_CARE_PROVIDER_SITE_OTHER): Payer: BC Managed Care – PPO | Admitting: General Surgery

## 2011-02-14 ENCOUNTER — Encounter (INDEPENDENT_AMBULATORY_CARE_PROVIDER_SITE_OTHER): Payer: BC Managed Care – PPO | Admitting: General Surgery

## 2011-02-14 ENCOUNTER — Encounter (INDEPENDENT_AMBULATORY_CARE_PROVIDER_SITE_OTHER): Payer: Self-pay | Admitting: General Surgery

## 2011-02-14 VITALS — BP 118/78 | HR 68 | Temp 97.7°F | Resp 16 | Ht 71.0 in | Wt 144.4 lb

## 2011-02-14 DIAGNOSIS — Z9889 Other specified postprocedural states: Secondary | ICD-10-CM

## 2011-02-14 NOTE — Progress Notes (Signed)
Subjective:     Patient ID: James Ivanoff, female   DOB: 07/01/1955, 56 y.o.   MRN: [id number]  HPI This 56 year old woman underwent left total mastectomy and sentinel node biopsy on December 21, 2010. She has invasive lobular carcinoma, pathologic stage T2, N0, receptor positive, HER-2 negative, Oncotype DX core letter. She is on arimidex and followed by  Dr. Welton Flakes. She feels fine and has no complaints. Review of Systems     Objective:   Physical Exam Left mastectomy wound has healed very nicely. No nodules ulcerations or fluid collections. Range of motion of the left shoulder is normal. There is no left arm swelling. There is no sensory deficit.    Assessment:     Invasive lobular carcinoma left breast,  stage T2, N0, receptor positive, HER-2-negative, low Oncotype DX score.  Doing well almost 2 months following left mastectomy a similar biopsy.    Plan:     She was encouraged to followup with Dr. Etter Sjogren discussed reconstructive options at her convenience.  Return to see me in 6 months  Plan mammogram right breast at one year.  Angelia Mould. Derrell Lolling, M.D., [facility] General and Minimally invasive Surgery Breast and Colorectal Surgery Office:   [phone] Pager:   [phone]

## 2011-02-14 NOTE — Patient Instructions (Signed)
Your left mastectomy wound has healed without complication. Continue the physical  therapy exercises that youhave. You may return to Dr.  Etter Sjogren to discuss reconstructive options when you are ready. I will see you in 6 months. We will plan mammogram of the right breast in one year.

## 2011-02-18 ENCOUNTER — Ambulatory Visit
Admission: RE | Admit: 2011-02-18 | Discharge: 2011-02-18 | Disposition: A | Discharge status: Home / Self Care | Payer: BC Managed Care – PPO | Source: Ambulatory Visit | Attending: Oncology | Admitting: Oncology

## 2011-02-18 DIAGNOSIS — C50919 Malignant neoplasm of unspecified site of unspecified female breast: Secondary | ICD-10-CM

## 2011-02-26 ENCOUNTER — Other Ambulatory Visit: Payer: Self-pay | Admitting: *Deleted

## 2011-02-26 ENCOUNTER — Telehealth: Payer: Self-pay | Admitting: *Deleted

## 2011-02-26 MED ORDER — ALENDRONATE SODIUM 70 MG PO TABS: 70.0000 mg | ORAL_TABLET | ORAL | Status: DC

## 2011-02-26 MED ORDER — CALCIUM CARBONATE-VITAMIN D 500-200 MG-UNIT PO TABS: 1.0000 | ORAL_TABLET | Freq: Every day | ORAL | Status: AC

## 2011-02-26 NOTE — Telephone Encounter (Signed)
Message copied by Cooper Render on Tue Feb 26, 2011  3:35 PM ------      Message from: Victorino December      Created: Mon Feb 25, 2011 10:31 PM       Call patient: begin taking Fosamax orally once a week, vitamin D and calcium and vitamin D3 1000 units  daily

## 2011-03-15 ENCOUNTER — Other Ambulatory Visit: Payer: BC Managed Care – PPO | Admitting: Lab

## 2011-03-15 ENCOUNTER — Ambulatory Visit: Payer: BC Managed Care – PPO | Admitting: Oncology

## 2011-04-10 ENCOUNTER — Ambulatory Visit (HOSPITAL_BASED_OUTPATIENT_CLINIC_OR_DEPARTMENT_OTHER): Payer: BC Managed Care – PPO | Admitting: Oncology

## 2011-04-10 ENCOUNTER — Encounter: Payer: Self-pay | Admitting: Oncology

## 2011-04-10 ENCOUNTER — Telehealth: Payer: Self-pay | Admitting: *Deleted

## 2011-04-10 VITALS — BP 150/76 | HR 76 | Temp 98.0°F | Ht 71.0 in | Wt 143.7 lb

## 2011-04-10 DIAGNOSIS — C50919 Malignant neoplasm of unspecified site of unspecified female breast: Secondary | ICD-10-CM

## 2011-04-10 DIAGNOSIS — Z9010 Acquired absence of unspecified breast and nipple: Secondary | ICD-10-CM

## 2011-04-10 DIAGNOSIS — Z79811 Long term (current) use of aromatase inhibitors: Secondary | ICD-10-CM

## 2011-04-10 DIAGNOSIS — Z170 Estrogen receptor positive status [ER+]: Secondary | ICD-10-CM

## 2011-04-10 MED ORDER — ANASTROZOLE 1 MG PO TABS: 1.0000 mg | ORAL_TABLET | Freq: Every day | ORAL | Status: DC

## 2011-04-10 NOTE — Progress Notes (Signed)
OFFICE PROGRESS NOTE   CC Dr. Claud Kelp  Dr. Antony Blackbird  DIAGNOSIS: 56 year old female with T2 N0 ER positive HER-2/neu negative invasive lobular carcinoma of the left breast status post left total mastectomy with sentinel node biopsy on 12/21/2010.  PRIOR THERAPY:  #1 patient was originally seen in the multidisciplinary breast clinic on 12/05/2010 for discussion of her new invasive lobular carcinoma of the left breast. The tumor was at the 12:00 position measuring about 3 cm. The biopsy showed an invasive lobular carcinoma ER positive PR positive proliferation marker 15% and HER-2/neu negative.  #2 she is now left total mastectomy with sentinel node biopsy on 12/21/2010. The final pathology revealed an invasive lobular carcinoma pathologic stage TII N0 ER positive PR positive HER-2/neu negative. Proliferation marker 15%.  #3 patient had an Oncotype DX score performed her breast cancer recurrence score was 6 giving her an average rate of distant recurrence about 5% with antiestrogen therapy.  #4 Arimidex 1 mg daily since December 2012  CURRENT THERAPY: antiestrogen therapy consisting of Arimidex 1 mg daily  INTERVAL HISTORY: Daisy Thomas 56 y.o. female returns for followup visit today. Overall she is doing well she is very pleased with her report especially the pathology report. She denies any fevers chills night sweats headaches shortness of breath chest pains palpitations she has no myalgias or arthralgias. Her mastectomy site is healing quite well. She is tolerating her AI well and no sied effects are reported. She did trip and sprained her ankle.  Remainder of the 10 point review of systems is negative.   MEDICAL HISTORY: Past Medical History  Diagnosis Date  . PONV (postoperative nausea and vomiting)   . Heart murmur     NO TX, OR MEDS  . Breast cancer 12/05/2010    left    ALLERGIES:  is allergic to other.  MEDICATIONS:  Current Outpatient Prescriptions    Medication Sig Dispense Refill  . alendronate (FOSAMAX) 70 MG tablet Take 1 tablet (70 mg total) by mouth every 7 (seven) days. Take with a full glass of water on an empty stomach.  4 tablet  3  . anastrozole (ARIMIDEX) 1 MG tablet Take 1 tablet (1 mg total) by mouth daily.  90 tablet  12  . calcium-vitamin D (OSCAL WITH D) 500-200 MG-UNIT per tablet Take 1 tablet by mouth daily.       No current facility-administered medications for this visit.   Facility-Administered Medications Ordered in Other Visits  Medication Dose Route Frequency Provider Last Rate Last Dose  . heparin injection 5,000 Units  5,000 Units Subcutaneous Once Ernestene Mention, MD        SURGICAL HISTORY:  Past Surgical History  Procedure Date  . Myomectomy 1986    ovarian fibroid  . Cesarean section 1990  . Mastectomy w/ sentinel node biopsy 12/21/2010    Procedure: MASTECTOMY WITH SENTINEL LYMPH NODE BIOPSY;  Surgeon: Ernestene Mention, MD;  Location: [facility] OR;  Service: General;  Laterality: Left;  Left total mastectomy & sentinel lymph node biopsy  . Breast lumpectomy     left breast   . Myomectomy   . Breast lumpectomy     left breast, benign- Dr. Samuella Cota    REVIEW OF SYSTEMS:  Pertinent items are noted in HPI.   PHYSICAL EXAMINATION: General appearance: alert, cooperative and appears stated age Head: Normocephalic, without obvious abnormality, atraumatic Neck: no adenopathy, no carotid bruit, no JVD, supple, symmetrical, trachea midline and thyroid not enlarged, symmetric, no tenderness/mass/nodules  Lymph nodes: Cervical, supraclavicular, and axillary nodes normal. Resp: clear to auscultation bilaterally and normal percussion bilaterally Back: symmetric, no curvature. ROM normal. No CVA tenderness. Cardio: regular rate and rhythm, S1, S2 normal, no murmur, click, rub or gallop GI: soft, non-tender; bowel sounds normal; no masses,  no organomegaly Extremities: extremities normal, atraumatic, no cyanosis or  edema Neurologic: Alert and oriented X 3, normal strength and tone. Normal symmetric reflexes. Normal coordination and gait  ECOG PERFORMANCE STATUS: 0 - Asymptomatic  Blood pressure 150/76, pulse 76, temperature 98 F (36.7 C), height 5\' 11"  (1.803 m), weight 143 lb 11.2 oz (65.182 kg).  LABORATORY DATA: Lab Results  Component Value Date   WBC 5.1 01/11/2011   HGB 14.1 01/11/2011   HCT 41.4 01/11/2011   MCV 88.5 01/11/2011   PLT 222 01/11/2011      Chemistry      Component Value Date/Time   NA 143 12/18/2010 1532   K 4.0 12/18/2010 1532   CL 105 12/18/2010 1532   CO2 29 12/18/2010 1532   BUN 6 12/18/2010 1532   CREATININE 0.78 12/18/2010 1532      Component Value Date/Time   CALCIUM 9.7 12/18/2010 1532   ALKPHOS 61 12/18/2010 1532   AST 12 12/18/2010 1532   ALT 13 12/18/2010 1532   BILITOT 0.3 12/18/2010 1532       RADIOGRAPHIC STUDIES:      ASSESSMENT: 56 year old female with:  1.  stage II ( T2 N0) invasive lobular carcinoma of the left breast status post mastectomy of the left breast. Postoperatively patient is doing extremely well. She had an Oncotype DX performed and the score is low giving her a 5% 5 year risk of rib relapse with antiestrogen therapy this is quite acceptable.  2. Receiving arimidex 1 mg daily since 12/12  3. Low bone mass by bone density scan recently   PLAN:  1. Tolerating arimidex well, no side effects reported  2. Prescriptions for arimidex  to her pharmacy.  3. Patient on Fosamax q weekly, we check her bone density periodically   All questions were answered. The patient knows to call the clinic with any problems, questions or concerns. We can certainly see the patient much sooner if necessary.  I spent 25 minutes counseling the patient face to face. The total time spent in the appointment was 30 minutes.    Drue Second, MD Medical/Oncology [facility] [phone] (beeper) [phone]  (Office)  04/10/2011, 12:19 PM

## 2011-04-10 NOTE — Patient Instructions (Signed)
1. You are doing well  2. Continue the arimidex on a daily basis  3. You are on Fosamax to help prevent osteoporosis. Your bone density showed low bone mass.  4. I will see you back in 6 months in follow up with blood work.  5. Your prescription was sent to the pharmacy  6. Call with any problems or concern at [phone] and ask for Dr. Rosine Beat nurse

## 2011-04-10 NOTE — Telephone Encounter (Signed)
gave patient appoijntment for 10-11-2011 starting 11:30 printed out calendar and gave to the patient

## 2011-06-19 ENCOUNTER — Encounter (INDEPENDENT_AMBULATORY_CARE_PROVIDER_SITE_OTHER): Payer: Self-pay | Admitting: General Surgery

## 2011-06-19 ENCOUNTER — Other Ambulatory Visit: Payer: Self-pay | Admitting: *Deleted

## 2011-06-19 NOTE — Telephone Encounter (Signed)
Pt called states " I am going to run out of fosamax and there are no refills on the bottle. Do I continue taking this or stop."  Next F/U 10/11/11- Lab/MD  MD currently out of office with review with Harriett Sine, NP Per MD's last note 04/10/11  ASSESSMENT: 56 year old female with:  1. stage II ( T2 N0) invasive lobular carcinoma of the left breast status post mastectomy of the left breast.  ostoperatively patient is doing extremely well. She had an Oncotype DX performed and the score is low giving her a 5% 5 year risk of rib relapse with antiestrogen therapy this is quite acceptable.  2. Receiving arimidex 1 mg daily since 12/12  3. Low bone mass by bone density scan recently   PLAN:  1. Tolerating arimidex well, no side effects reported  2. Prescriptions for arimidex to her pharmacy.  3. Patient on Fosamax q weekly, we check her bone density periodically

## 2011-06-20 NOTE — Telephone Encounter (Signed)
Please refill Fosamax x 3. (Arimidex may worsen bone loss.)

## 2011-06-21 MED ORDER — ALENDRONATE SODIUM 70 MG PO TABS: 70.0000 mg | ORAL_TABLET | ORAL | Status: DC

## 2011-06-21 NOTE — Telephone Encounter (Signed)
Per NR, notified pt to continue Fosamax. Arimidex may worsen lone loss. Pt instructed to call clinic if she has any concerns. Pt next f/u 9/20. Rx sent to pt's pharmacy. Pt verbalized understanding and denied needing further assistance.

## 2011-09-09 ENCOUNTER — Encounter (INDEPENDENT_AMBULATORY_CARE_PROVIDER_SITE_OTHER): Payer: Self-pay | Admitting: General Surgery

## 2011-09-09 ENCOUNTER — Ambulatory Visit (INDEPENDENT_AMBULATORY_CARE_PROVIDER_SITE_OTHER): Payer: BC Managed Care – PPO | Admitting: General Surgery

## 2011-09-09 VITALS — BP 122/64 | HR 64 | Temp 97.8°F | Resp 12 | Ht 71.0 in | Wt 148.0 lb

## 2011-09-09 DIAGNOSIS — C50919 Malignant neoplasm of unspecified site of unspecified female breast: Secondary | ICD-10-CM

## 2011-09-09 DIAGNOSIS — C50912 Malignant neoplasm of unspecified site of left female breast: Secondary | ICD-10-CM

## 2011-09-09 NOTE — Progress Notes (Signed)
Patient ID: Daisy Thomas, female   DOB: 04-13-1955, 56 y.o.   MRN: [id number]  Chief Complaint  Patient presents with  . Routine Post Op    breast ck    HPI Daisy Thomas is a 56 y.o. female.  She returns for long-term followup regarding her left breast cancer.  On 12/21/2010 this patient underwent left total mastectomy and sentinel node biopsy. She had invasive lobular carcinoma, pathologic stage T2, N0, receptor positive, HER-2-negative, proliferation index 15%. Oncotype DX was 6 which is average recurrence score.  She takes arimidex since December 2012 and is also on Fosamax.  She has seen Dr. Etter Sjogren regarding delayed reconstruction. She still thinks she is interested in that but is trying to decide when would be the best time to do that. I have encouraged her to continue to consider this.  She has no complaints about her mastectomy wound or her right breast. She otherwise feels well and doesn't have any significant health problems. HPI  Past Medical History  Diagnosis Date  . PONV (postoperative nausea and vomiting)   . Heart murmur     NO TX, OR MEDS  . Breast cancer 12/05/2010    left    Past Surgical History  Procedure Date  . Myomectomy 1986    ovarian fibroid  . Cesarean section 1990  . Mastectomy w/ sentinel node biopsy 12/21/2010    Procedure: MASTECTOMY WITH SENTINEL LYMPH NODE BIOPSY;  Surgeon: Ernestene Mention, MD;  Location: [facility] OR;  Service: General;  Laterality: Left;  Left total mastectomy & sentinel lymph node biopsy  . Breast lumpectomy     left breast   . Myomectomy   . Breast lumpectomy     left breast, benign- Dr. Samuella Cota    Family History  Problem Relation Age of Onset  . Cancer Maternal Aunt     breast  . Cancer Maternal Grandmother     lung  . Heart disease Paternal Grandfather     Social History History  Substance Use Topics  . Smoking status: Never Smoker   . Smokeless tobacco: Never Used  . Alcohol Use: Yes     OCC.     Allergies  Allergen Reactions  . Other Swelling    NUTS    Current Outpatient Prescriptions  Medication Sig Dispense Refill  . alendronate (FOSAMAX) 70 MG tablet Take 1 tablet (70 mg total) by mouth every 7 (seven) days. Take with a full glass of water on an empty stomach.  4 tablet  3  . anastrozole (ARIMIDEX) 1 MG tablet Take 1 tablet (1 mg total) by mouth daily.  90 tablet  12  . calcium-vitamin D (OSCAL WITH D) 500-200 MG-UNIT per tablet Take 1 tablet by mouth daily.       No current facility-administered medications for this visit.   Facility-Administered Medications Ordered in Other Visits  Medication Dose Route Frequency Provider Last Rate Last Dose  . heparin injection 5,000 Units  5,000 Units Subcutaneous Once Ernestene Mention, MD        Review of Systems Review of Systems  Constitutional: Negative for fever, chills and unexpected weight change.  HENT: Negative for hearing loss, congestion, sore throat, trouble swallowing and voice change.   Eyes: Negative for visual disturbance.  Respiratory: Negative for cough and wheezing.   Cardiovascular: Negative for chest pain, palpitations and leg swelling.  Gastrointestinal: Negative for nausea, vomiting, abdominal pain, diarrhea, constipation, blood in stool, abdominal distention and anal bleeding.  Genitourinary:  Negative for hematuria, vaginal bleeding and difficulty urinating.  Musculoskeletal: Negative for arthralgias.  Skin: Negative for rash and wound.  Neurological: Negative for seizures, syncope and headaches.  Hematological: Negative for adenopathy. Does not bruise/bleed easily.  Psychiatric/Behavioral: Negative for confusion.    Blood pressure 122/64, pulse 64, temperature 97.8 F (36.6 C), temperature source Temporal, resp. rate 12, height 5\' 11"  (1.803 m), weight 148 lb (67.132 kg).  Physical Exam Physical Exam  Constitutional: She is oriented to person, place, and time. She appears well-developed and  well-nourished. No distress.  HENT:  Head: Normocephalic and atraumatic.  Eyes: Conjunctivae and EOM are normal. Pupils are equal, round, and reactive to light. Left eye exhibits no discharge. No scleral icterus.  Neck: Neck supple. No JVD present. No tracheal deviation present. No thyromegaly present.  Cardiovascular: Normal rate, regular rhythm, normal heart sounds and intact distal pulses.   No murmur heard. Pulmonary/Chest: Effort normal and breath sounds normal. No respiratory distress. She has no wheezes. She has no rales. She exhibits no tenderness.       Left mastectomy scar is well healed. Skin healthy. No nodules, ulceration, or adenopathy. Range of motion left shoulder 100%. Right breast reveals small breast, no skin change, no palpable mass, no axillary adenopathy. Nipple and areola normal.  Musculoskeletal: She exhibits no edema and no tenderness.  Lymphadenopathy:    She has no cervical adenopathy.  Neurological: She is alert and oriented to person, place, and time. She exhibits normal muscle tone. Coordination normal.  Skin: Skin is warm. No rash noted. She is not diaphoretic. No erythema. No pallor.  Psychiatric: She has a normal mood and affect. Her behavior is normal. Judgment and thought content normal.    Data Reviewed Old notes from [facility] and [facility].  Assessment    Invasive lobular carcinoma of left breast, pathologic stage T2, N0, receptor positive, HER-2 negative, proliferation index 15%.  No evidence of recurrence 9 months following left total mastectomy and sentinel node biopsy    Plan    Continue arimidex and followup with Dr. Drue Second  Mammogram right breast in November 2013  She will followup with Dr. Etter Sjogren when she feels ready to proceed with reconstruction.  Return to see me in 6 months      Mahnoor Mathisen M. Derrell Lolling, M.D., [facility] General and Minimally invasive Surgery Breast and Colorectal Surgery Office:    [phone] Pager:   [phone]  09/09/2011, 8:48 AM

## 2011-09-09 NOTE — Patient Instructions (Signed)
Your physical exam today is normal. There is no evidence of breast cancer on either side. All of your surgical wounds have healed well.  Keep her regular appointment Dr. Drue Second.  Be sure to get mammograms of the right breast in November of this year.  Return to see Dr. Derrell Lolling in 6 months.

## 2011-10-11 ENCOUNTER — Encounter: Payer: Self-pay | Admitting: Oncology

## 2011-10-11 ENCOUNTER — Telehealth: Payer: Self-pay | Admitting: *Deleted

## 2011-10-11 ENCOUNTER — Ambulatory Visit (HOSPITAL_BASED_OUTPATIENT_CLINIC_OR_DEPARTMENT_OTHER): Payer: BC Managed Care – PPO | Admitting: Oncology

## 2011-10-11 ENCOUNTER — Other Ambulatory Visit (HOSPITAL_BASED_OUTPATIENT_CLINIC_OR_DEPARTMENT_OTHER): Payer: BC Managed Care – PPO | Admitting: Lab

## 2011-10-11 VITALS — BP 121/81 | HR 73 | Temp 98.4°F | Resp 20 | Ht 71.0 in | Wt 145.2 lb

## 2011-10-11 DIAGNOSIS — C50919 Malignant neoplasm of unspecified site of unspecified female breast: Secondary | ICD-10-CM

## 2011-10-11 DIAGNOSIS — E559 Vitamin D deficiency, unspecified: Secondary | ICD-10-CM

## 2011-10-11 DIAGNOSIS — Z170 Estrogen receptor positive status [ER+]: Secondary | ICD-10-CM

## 2011-10-11 LAB — COMPREHENSIVE METABOLIC PANEL (CC13)
Albumin: 4.3 g/dL (ref 3.5–5.0)
BUN: 10 mg/dL (ref 7.0–26.0)
Calcium: 9.9 mg/dL (ref 8.4–10.4)
Chloride: 105 mEq/L (ref 98–107)
Creatinine: 0.9 mg/dL (ref 0.6–1.1)
Glucose: 86 mg/dl (ref 70–99)
Potassium: 4 mEq/L (ref 3.5–5.1)

## 2011-10-11 LAB — CBC WITH DIFFERENTIAL/PLATELET
Eosinophils Absolute: 0 10*3/uL (ref 0.0–0.5)
HCT: 43.2 % (ref 34.8–46.6)
LYMPH%: 21.2 % (ref 14.0–49.7)
MCHC: 34 g/dL (ref 31.5–36.0)
MCV: 88.7 fL (ref 79.5–101.0)
MONO#: 0.5 10*3/uL (ref 0.1–0.9)
MONO%: 7.4 % (ref 0.0–14.0)
NEUT%: 70.5 % (ref 38.4–76.8)
Platelets: 200 10*3/uL (ref 145–400)
RBC: 4.87 10*6/uL (ref 3.70–5.45)
WBC: 6.7 10*3/uL (ref 3.9–10.3)
nRBC: 0 % (ref 0–0)

## 2011-10-11 MED ORDER — ALENDRONATE SODIUM 70 MG PO TABS: 70.0000 mg | ORAL_TABLET | ORAL | Status: AC

## 2011-10-11 NOTE — Progress Notes (Signed)
OFFICE PROGRESS NOTE   CC Dr. Claud Kelp  Dr. Antony Blackbird  DIAGNOSIS: 56 year old female with T2 N0 ER positive HER-2/neu negative invasive lobular carcinoma of the left breast status post left total mastectomy with sentinel node biopsy on 12/21/2010.  PRIOR THERAPY:  #1 patient was originally seen in the multidisciplinary breast clinic on 12/05/2010 for discussion of her new invasive lobular carcinoma of the left breast. The tumor was at the 12:00 position measuring about 3 cm. The biopsy showed an invasive lobular carcinoma ER positive PR positive proliferation marker 15% and HER-2/neu negative.  #2 she is now left total mastectomy with sentinel node biopsy on 12/21/2010. The final pathology revealed an invasive lobular carcinoma pathologic stage TII N0 ER positive PR positive HER-2/neu negative. Proliferation marker 15%.  #3 patient had an Oncotype DX score performed her breast cancer recurrence score was 6 giving her an average rate of distant recurrence about 5% with antiestrogen therapy.  #4 Arimidex 1 mg daily since December 2012  CURRENT THERAPY: antiestrogen therapy consisting of Arimidex 1 mg daily  INTERVAL HISTORY: Daisy Thomas 56 y.o. female returns for followup visit today. Overall she is doing well she is very pleased with her report especially the pathology report. She denies any fevers chills night sweats headaches shortness of breath chest pains palpitations she has no myalgias or arthralgias. Her mastectomy site is healing quite well. She is tolerating her AI well and no sied effects are reported. She did trip and sprained her ankle.  Remainder of the 10 point review of systems is negative.   MEDICAL HISTORY: Past Medical History  Diagnosis Date  . PONV (postoperative nausea and vomiting)   . Heart murmur     NO TX, OR MEDS  . Breast cancer 12/05/2010    left    ALLERGIES:  is allergic to other.  MEDICATIONS:  Current Outpatient Prescriptions    Medication Sig Dispense Refill  . alendronate (FOSAMAX) 70 MG tablet Take 1 tablet (70 mg total) by mouth every 7 (seven) days. Take with a full glass of water on an empty stomach.  4 tablet  3  . anastrozole (ARIMIDEX) 1 MG tablet Take 1 tablet (1 mg total) by mouth daily.  90 tablet  12  . calcium-vitamin D (OSCAL WITH D) 500-200 MG-UNIT per tablet Take 1 tablet by mouth daily.       No current facility-administered medications for this visit.   Facility-Administered Medications Ordered in Other Visits  Medication Dose Route Frequency Provider Last Rate Last Dose  . heparin injection 5,000 Units  5,000 Units Subcutaneous Once Ernestene Mention, MD        SURGICAL HISTORY:  Past Surgical History  Procedure Date  . Myomectomy 1986    ovarian fibroid  . Cesarean section 1990  . Mastectomy w/ sentinel node biopsy 12/21/2010    Procedure: MASTECTOMY WITH SENTINEL LYMPH NODE BIOPSY;  Surgeon: Ernestene Mention, MD;  Location: [facility] OR;  Service: General;  Laterality: Left;  Left total mastectomy & sentinel lymph node biopsy  . Breast lumpectomy     left breast   . Myomectomy   . Breast lumpectomy     left breast, benign- Dr. Samuella Cota    REVIEW OF SYSTEMS:  Pertinent items are noted in HPI.   PHYSICAL EXAMINATION: General appearance: alert, cooperative and appears stated age Head: Normocephalic, without obvious abnormality, atraumatic Neck: no adenopathy, no carotid bruit, no JVD, supple, symmetrical, trachea midline and thyroid not enlarged, symmetric, no tenderness/mass/nodules  Lymph nodes: Cervical, supraclavicular, and axillary nodes normal. Resp: clear to auscultation bilaterally and normal percussion bilaterally Back: symmetric, no curvature. ROM normal. No CVA tenderness. Cardio: regular rate and rhythm, S1, S2 normal, no murmur, click, rub or gallop GI: soft, non-tender; bowel sounds normal; no masses,  no organomegaly Extremities: extremities normal, atraumatic, no cyanosis or  edema Neurologic: Alert and oriented X 3, normal strength and tone. Normal symmetric reflexes. Normal coordination and gait  ECOG PERFORMANCE STATUS: 0 - Asymptomatic  Blood pressure 121/81, pulse 73, temperature 98.4 F (36.9 C), temperature source Oral, resp. rate 20, height 5\' 11"  (1.803 m), weight 145 lb 3.2 oz (65.862 kg).  LABORATORY DATA: Lab Results  Component Value Date   WBC 6.7 10/11/2011   HGB 14.7 10/11/2011   HCT 43.2 10/11/2011   MCV 88.7 10/11/2011   PLT 200 10/11/2011      Chemistry      Component Value Date/Time   NA 142 10/11/2011 1045   NA 143 12/18/2010 1532   K 4.0 10/11/2011 1045   K 4.0 12/18/2010 1532   CL 105 10/11/2011 1045   CL 105 12/18/2010 1532   CO2 27 10/11/2011 1045   CO2 29 12/18/2010 1532   BUN 10.0 10/11/2011 1045   BUN 6 12/18/2010 1532   CREATININE 0.9 10/11/2011 1045   CREATININE 0.78 12/18/2010 1532      Component Value Date/Time   CALCIUM 9.9 10/11/2011 1045   CALCIUM 9.7 12/18/2010 1532   ALKPHOS 62 10/11/2011 1045   ALKPHOS 61 12/18/2010 1532   AST 12 10/11/2011 1045   AST 12 12/18/2010 1532   ALT 12 10/11/2011 1045   ALT 13 12/18/2010 1532   BILITOT 0.50 10/11/2011 1045   BILITOT 0.3 12/18/2010 1532       RADIOGRAPHIC STUDIES:      ASSESSMENT: 56 year old female with:  1.  stage II ( T2 N0) invasive lobular carcinoma of the left breast status post mastectomy of the left breast. Postoperatively patient is doing extremely well. She had an Oncotype DX performed and the score is low giving her a 5% 5 year risk of rib relapse with antiestrogen therapy this is quite acceptable.  2. Receiving arimidex 1 mg daily since 12/12  3. Low bone mass by bone density scan recently   PLAN:  1. Tolerating arimidex well, no side effects reported  2. Prescriptions for arimidex  to her pharmacy.  3. Patient on Fosamax q weekly, we check her bone density periodically   All questions were answered. The patient knows to call the clinic with  any problems, questions or concerns. We can certainly see the patient much sooner if necessary.  I spent 25 minutes counseling the patient face to face. The total time spent in the appointment was 30 minutes.    Drue Second, MD Medical/Oncology [facility] [phone] (beeper) [phone] (Office)  10/11/2011, 12:00 PM

## 2011-10-11 NOTE — Patient Instructions (Addendum)
Doing well  Continue to take arimidex and fosamax  I will see you back in 6 months  Please call with any problems or questions

## 2011-10-11 NOTE — Progress Notes (Signed)
OFFICE PROGRESS NOTE   CC Dr. Claud Kelp  Dr. Antony Blackbird  DIAGNOSIS: 56 year old female with T2 N0 ER positive HER-2/neu negative invasive lobular carcinoma of the left breast status post left total mastectomy with sentinel node biopsy on 12/21/2010.  PRIOR THERAPY:  #1 patient was originally seen in the multidisciplinary breast clinic on 12/05/2010 for discussion of her new invasive lobular carcinoma of the left breast. The tumor was at the 12:00 position measuring about 3 cm. The biopsy showed an invasive lobular carcinoma ER positive PR positive proliferation marker 15% and HER-2/neu negative.  #2 she is now left total mastectomy with sentinel node biopsy on 12/21/2010. The final pathology revealed an invasive lobular carcinoma pathologic stage TII N0 ER positive PR positive HER-2/neu negative. Proliferation marker 15%.  #3 patient had an Oncotype DX score performed her breast cancer recurrence score was 6 giving her an average rate of distant recurrence about 5% with antiestrogen therapy.  #4 Arimidex 1 mg daily since December 2012  CURRENT THERAPY: antiestrogen therapy consisting of Arimidex 1 mg daily  INTERVAL HISTORY: Daisy Thomas 56 y.o. female returns for followup visit today. Overall she is doing well she is very pleased with her report especially the pathology report. She denies any fevers chills night sweats headaches shortness of breath chest pains palpitations she has no myalgias or arthralgias. Her mastectomy site is healing quite well. She is tolerating her AI well and no sied effects are reported. She did trip and sprained her ankle.  Remainder of the 10 point review of systems is negative.   MEDICAL HISTORY: Past Medical History  Diagnosis Date  . PONV (postoperative nausea and vomiting)   . Heart murmur     NO TX, OR MEDS  . Breast cancer 12/05/2010    left    ALLERGIES:  is allergic to other.  MEDICATIONS:  Current Outpatient Prescriptions    Medication Sig Dispense Refill  . alendronate (FOSAMAX) 70 MG tablet Take 1 tablet (70 mg total) by mouth every 7 (seven) days. Take with a full glass of water on an empty stomach.  4 tablet  12  . anastrozole (ARIMIDEX) 1 MG tablet Take 1 tablet (1 mg total) by mouth daily.  90 tablet  12  . calcium-vitamin D (OSCAL WITH D) 500-200 MG-UNIT per tablet Take 1 tablet by mouth daily.       No current facility-administered medications for this visit.   Facility-Administered Medications Ordered in Other Visits  Medication Dose Route Frequency Provider Last Rate Last Dose  . heparin injection 5,000 Units  5,000 Units Subcutaneous Once Ernestene Mention, MD        SURGICAL HISTORY:  Past Surgical History  Procedure Date  . Myomectomy 1986    ovarian fibroid  . Cesarean section 1990  . Mastectomy w/ sentinel node biopsy 12/21/2010    Procedure: MASTECTOMY WITH SENTINEL LYMPH NODE BIOPSY;  Surgeon: Ernestene Mention, MD;  Location: [facility] OR;  Service: General;  Laterality: Left;  Left total mastectomy & sentinel lymph node biopsy  . Breast lumpectomy     left breast   . Myomectomy   . Breast lumpectomy     left breast, benign- Dr. Samuella Cota    REVIEW OF SYSTEMS:  Pertinent items are noted in HPI.   PHYSICAL EXAMINATION: General appearance: alert, cooperative and appears stated age Head: Normocephalic, without obvious abnormality, atraumatic Neck: no adenopathy, no carotid bruit, no JVD, supple, symmetrical, trachea midline and thyroid not enlarged, symmetric, no tenderness/mass/nodules  Lymph nodes: Cervical, supraclavicular, and axillary nodes normal. Resp: clear to auscultation bilaterally and normal percussion bilaterally Back: symmetric, no curvature. ROM normal. No CVA tenderness. Cardio: regular rate and rhythm, S1, S2 normal, no murmur, click, rub or gallop GI: soft, non-tender; bowel sounds normal; no masses,  no organomegaly Extremities: extremities normal, atraumatic, no cyanosis or  edema Neurologic: Alert and oriented X 3, normal strength and tone. Normal symmetric reflexes. Normal coordination and gait  ECOG PERFORMANCE STATUS: 0 - Asymptomatic  Blood pressure 121/81, pulse 73, temperature 98.4 F (36.9 C), temperature source Oral, resp. rate 20, height 5\' 11"  (1.803 m), weight 145 lb 3.2 oz (65.862 kg).  LABORATORY DATA: Lab Results  Component Value Date   WBC 6.7 10/11/2011   HGB 14.7 10/11/2011   HCT 43.2 10/11/2011   MCV 88.7 10/11/2011   PLT 200 10/11/2011      Chemistry      Component Value Date/Time   NA 142 10/11/2011 1045   NA 143 12/18/2010 1532   K 4.0 10/11/2011 1045   K 4.0 12/18/2010 1532   CL 105 10/11/2011 1045   CL 105 12/18/2010 1532   CO2 27 10/11/2011 1045   CO2 29 12/18/2010 1532   BUN 10.0 10/11/2011 1045   BUN 6 12/18/2010 1532   CREATININE 0.9 10/11/2011 1045   CREATININE 0.78 12/18/2010 1532      Component Value Date/Time   CALCIUM 9.9 10/11/2011 1045   CALCIUM 9.7 12/18/2010 1532   ALKPHOS 62 10/11/2011 1045   ALKPHOS 61 12/18/2010 1532   AST 12 10/11/2011 1045   AST 12 12/18/2010 1532   ALT 12 10/11/2011 1045   ALT 13 12/18/2010 1532   BILITOT 0.50 10/11/2011 1045   BILITOT 0.3 12/18/2010 1532       RADIOGRAPHIC STUDIES:      ASSESSMENT: 56 year old female with:  1.  stage II ( T2 N0) invasive lobular carcinoma of the left breast status post mastectomy of the left breast. Postoperatively patient is doing extremely well. She had an Oncotype DX performed and the score is low giving her a 5% 5 year risk of rib relapse with antiestrogen therapy this is quite acceptable.  2. Receiving arimidex 1 mg daily since 12/12  3. Low bone mass by bone density scan recently   PLAN:  1. Tolerating arimidex well, no side effects reported  2. Prescriptions for arimidex  to her pharmacy.  3. Patient on Fosamax q weekly, we check her bone density periodically   All questions were answered. The patient knows to call the clinic with  any problems, questions or concerns. We can certainly see the patient much sooner if necessary.  I spent 15 minutes counseling the patient face to face. The total time spent in the appointment was 30 minutes.    Drue Second, MD Medical/Oncology [facility] [phone] (beeper) [phone] (Office)  10/11/2011, 12:11 PM

## 2011-10-11 NOTE — Telephone Encounter (Signed)
04-10-2012 starting at 11:00am

## 2011-10-24 ENCOUNTER — Other Ambulatory Visit: Payer: Self-pay | Admitting: Obstetrics and Gynecology

## 2011-10-24 DIAGNOSIS — Z9012 Acquired absence of left breast and nipple: Secondary | ICD-10-CM

## 2011-10-24 DIAGNOSIS — Z853 Personal history of malignant neoplasm of breast: Secondary | ICD-10-CM

## 2011-11-29 ENCOUNTER — Ambulatory Visit
Admission: RE | Admit: 2011-11-29 | Discharge: 2011-11-29 | Disposition: A | Discharge status: Home / Self Care | Payer: BC Managed Care – PPO | Source: Ambulatory Visit | Attending: Obstetrics and Gynecology | Admitting: Obstetrics and Gynecology

## 2011-11-29 ENCOUNTER — Other Ambulatory Visit: Payer: Self-pay | Admitting: Obstetrics and Gynecology

## 2011-11-29 DIAGNOSIS — Z1231 Encounter for screening mammogram for malignant neoplasm of breast: Secondary | ICD-10-CM

## 2011-11-29 DIAGNOSIS — Z853 Personal history of malignant neoplasm of breast: Secondary | ICD-10-CM

## 2011-11-29 DIAGNOSIS — Z9012 Acquired absence of left breast and nipple: Secondary | ICD-10-CM

## 2011-12-03 ENCOUNTER — Other Ambulatory Visit: Payer: Self-pay | Admitting: Obstetrics and Gynecology

## 2011-12-03 DIAGNOSIS — R928 Other abnormal and inconclusive findings on diagnostic imaging of breast: Secondary | ICD-10-CM

## 2011-12-10 ENCOUNTER — Ambulatory Visit
Admission: RE | Admit: 2011-12-10 | Discharge: 2011-12-10 | Disposition: A | Discharge status: Home / Self Care | Payer: BC Managed Care – PPO | Source: Ambulatory Visit | Attending: Obstetrics and Gynecology | Admitting: Obstetrics and Gynecology

## 2011-12-10 DIAGNOSIS — R928 Other abnormal and inconclusive findings on diagnostic imaging of breast: Secondary | ICD-10-CM

## 2012-04-10 ENCOUNTER — Ambulatory Visit: Payer: BC Managed Care – PPO | Admitting: Oncology

## 2012-04-10 ENCOUNTER — Other Ambulatory Visit: Payer: BC Managed Care – PPO | Admitting: Lab

## 2012-04-10 ENCOUNTER — Telehealth: Payer: Self-pay | Admitting: *Deleted

## 2012-04-10 NOTE — Telephone Encounter (Signed)
Called pt to cancel f/u appt with Dr. Welton Flakes d/t illness.  Informed pt that we will call her with new appt date and time.

## 2012-07-20 ENCOUNTER — Other Ambulatory Visit: Payer: Self-pay | Admitting: Emergency Medicine

## 2012-07-20 DIAGNOSIS — C50919 Malignant neoplasm of unspecified site of unspecified female breast: Secondary | ICD-10-CM

## 2012-07-20 MED ORDER — ANASTROZOLE 1 MG PO TABS: 1.0000 mg | ORAL_TABLET | Freq: Every day | ORAL | Status: DC

## 2012-07-21 ENCOUNTER — Telehealth: Payer: Self-pay | Admitting: Oncology

## 2012-08-19 ENCOUNTER — Encounter: Payer: Self-pay | Admitting: Oncology

## 2012-08-19 ENCOUNTER — Ambulatory Visit (HOSPITAL_BASED_OUTPATIENT_CLINIC_OR_DEPARTMENT_OTHER): Payer: BC Managed Care – PPO | Admitting: Oncology

## 2012-08-19 ENCOUNTER — Other Ambulatory Visit (HOSPITAL_BASED_OUTPATIENT_CLINIC_OR_DEPARTMENT_OTHER): Payer: BC Managed Care – PPO | Admitting: Lab

## 2012-08-19 ENCOUNTER — Telehealth: Payer: Self-pay | Admitting: Oncology

## 2012-08-19 VITALS — BP 131/81 | HR 56 | Temp 98.5°F | Resp 20 | Ht 71.0 in | Wt 145.3 lb

## 2012-08-19 DIAGNOSIS — C50919 Malignant neoplasm of unspecified site of unspecified female breast: Secondary | ICD-10-CM

## 2012-08-19 DIAGNOSIS — C50912 Malignant neoplasm of unspecified site of left female breast: Secondary | ICD-10-CM

## 2012-08-19 DIAGNOSIS — E559 Vitamin D deficiency, unspecified: Secondary | ICD-10-CM

## 2012-08-19 DIAGNOSIS — M949 Disorder of cartilage, unspecified: Secondary | ICD-10-CM

## 2012-08-19 DIAGNOSIS — M8580 Other specified disorders of bone density and structure, unspecified site: Secondary | ICD-10-CM

## 2012-08-19 LAB — COMPREHENSIVE METABOLIC PANEL (CC13)
ALT: 11 U/L (ref 0–55)
AST: 12 U/L (ref 5–34)
Alkaline Phosphatase: 46 U/L (ref 40–150)
Sodium: 145 mEq/L (ref 136–145)
Total Bilirubin: 0.47 mg/dL (ref 0.20–1.20)
Total Protein: 6.8 g/dL (ref 6.4–8.3)

## 2012-08-19 LAB — CBC WITH DIFFERENTIAL/PLATELET
EOS%: 1.3 % (ref 0.0–7.0)
MCH: 29.9 pg (ref 25.1–34.0)
MCV: 89.8 fL (ref 79.5–101.0)
MONO%: 8.3 % (ref 0.0–14.0)
NEUT#: 4.7 10*3/uL (ref 1.5–6.5)
RBC: 4.69 10*6/uL (ref 3.70–5.45)
RDW: 12.9 % (ref 11.2–14.5)
nRBC: 0 % (ref 0–0)

## 2012-08-19 NOTE — Progress Notes (Signed)
OFFICE PROGRESS NOTE   CC Dr. Claud Kelp  Dr. Antony Blackbird  DIAGNOSIS: 56 year old female with T2 N0 ER positive HER-2/neu negative invasive lobular carcinoma of the left breast status post left total mastectomy with sentinel node biopsy on 12/21/2010.  PRIOR THERAPY:  #1 patient was originally seen in the multidisciplinary breast clinic on 12/05/2010 for discussion of her new invasive lobular carcinoma of the left breast. The tumor was at the 12:00 position measuring about 3 cm. The biopsy showed an invasive lobular carcinoma ER positive PR positive proliferation marker 15% and HER-2/neu negative.  #2 she is now left total mastectomy with sentinel node biopsy on 12/21/2010. The final pathology revealed an invasive lobular carcinoma pathologic stage TII N0 ER positive PR positive HER-2/neu negative. Proliferation marker 15%.  #3 patient had an Oncotype DX score performed her breast cancer recurrence score was 6 giving her an average rate of distant recurrence about 5% with antiestrogen therapy.  #4 Arimidex 1 mg daily since December 2012  CURRENT THERAPY: antiestrogen therapy consisting of Arimidex 1 mg daily  INTERVAL HISTORY: Daisy Thomas 56 y.o. female returns for followup visit today. Overall she is doing well. She denies any fevers chills night sweats headaches shortness of breath chest pains palpitations she has no myalgias or arthralgias. Her mastectomy site is healing quite well. She is tolerating her AI well and no sied effects are reported. She did trip and sprained her ankle.  Remainder of the 10 point review of systems is negative.   MEDICAL HISTORY: Past Medical History  Diagnosis Date  . PONV (postoperative nausea and vomiting)   . Heart murmur     NO TX, OR MEDS  . Breast cancer 12/05/2010    left    ALLERGIES:  is allergic to other.  MEDICATIONS:  Current Outpatient Prescriptions  Medication Sig Dispense Refill  . alendronate (FOSAMAX) 70 MG tablet  Take 1 tablet (70 mg total) by mouth every 7 (seven) days. Take with a full glass of water on an empty stomach.  4 tablet  12  . anastrozole (ARIMIDEX) 1 MG tablet Take 1 tablet (1 mg total) by mouth daily.  90 tablet  12  . calcium-vitamin D (OSCAL WITH D) 500-200 MG-UNIT per tablet Take 1 tablet by mouth daily.       No current facility-administered medications for this visit.   Facility-Administered Medications Ordered in Other Visits  Medication Dose Route Frequency Provider Last Rate Last Dose  . heparin injection 5,000 Units  5,000 Units Subcutaneous Once Ernestene Mention, MD        SURGICAL HISTORY:  Past Surgical History  Procedure Laterality Date  . Myomectomy  1986    ovarian fibroid  . Cesarean section  1990  . Mastectomy w/ sentinel node biopsy  12/21/2010    Procedure: MASTECTOMY WITH SENTINEL LYMPH NODE BIOPSY;  Surgeon: Ernestene Mention, MD;  Location: [facility] OR;  Service: General;  Laterality: Left;  Left total mastectomy & sentinel lymph node biopsy  . Breast lumpectomy      left breast   . Myomectomy    . Breast lumpectomy      left breast, benign- Dr. Samuella Cota    REVIEW OF SYSTEMS:  Pertinent items are noted in HPI.   PHYSICAL EXAMINATION: General appearance: alert, cooperative and appears stated age Head: Normocephalic, without obvious abnormality, atraumatic Neck: no adenopathy, no carotid bruit, no JVD, supple, symmetrical, trachea midline and thyroid not enlarged, symmetric, no tenderness/mass/nodules Lymph nodes: Cervical, supraclavicular, and  axillary nodes normal. Resp: clear to auscultation bilaterally and normal percussion bilaterally Back: symmetric, no curvature. ROM normal. No CVA tenderness. Cardio: regular rate and rhythm, S1, S2 normal, no murmur, click, rub or gallop GI: soft, non-tender; bowel sounds normal; no masses,  no organomegaly Extremities: extremities normal, atraumatic, no cyanosis or edema Neurologic: Alert and oriented X 3, normal strength  and tone. Normal symmetric reflexes. Normal coordination and gait  ECOG PERFORMANCE STATUS: 0 - Asymptomatic  Blood pressure 131/81, pulse 56, temperature 98.5 F (36.9 C), temperature source Oral, resp. rate 20, height 5\' 11"  (1.803 m), weight 145 lb 4.8 oz (65.908 kg).  LABORATORY DATA: Lab Results  Component Value Date   WBC 7.1 08/19/2012   HGB 14.0 08/19/2012   HCT 42.1 08/19/2012   MCV 89.8 08/19/2012   PLT 208 08/19/2012      Chemistry      Component Value Date/Time   NA 142 10/11/2011 1045   NA 143 12/18/2010 1532   K 4.0 10/11/2011 1045   K 4.0 12/18/2010 1532   CL 105 10/11/2011 1045   CL 105 12/18/2010 1532   CO2 27 10/11/2011 1045   CO2 29 12/18/2010 1532   BUN 10.0 10/11/2011 1045   BUN 6 12/18/2010 1532   CREATININE 0.9 10/11/2011 1045   CREATININE 0.78 12/18/2010 1532      Component Value Date/Time   CALCIUM 9.9 10/11/2011 1045   CALCIUM 9.7 12/18/2010 1532   ALKPHOS 62 10/11/2011 1045   ALKPHOS 61 12/18/2010 1532   AST 12 10/11/2011 1045   AST 12 12/18/2010 1532   ALT 12 10/11/2011 1045   ALT 13 12/18/2010 1532   BILITOT 0.50 10/11/2011 1045   BILITOT 0.3 12/18/2010 1532       RADIOGRAPHIC STUDIES:      ASSESSMENT: 57 year old female with:  1.  stage II ( T2 N0) invasive lobular carcinoma of the left breast status post mastectomy of the left breast. Postoperatively patient is doing extremely well. She had an Oncotype DX performed and the score is low giving her a 5% 5 year risk of rib relapse with antiestrogen therapy this is quite acceptable.  2. Receiving arimidex 1 mg daily since 12/12  3. Low bone mass by bone density scan , we will repeat a bone density prior to her next visit.   PLAN:  1. Continue arimidex 1 mg daily 2. Bone density and vitamin D levels prior to next visit 3. I will see her back in 6 months  All questions were answered. The patient knows to call the clinic with any problems, questions or concerns. We can certainly see the  patient much sooner if necessary.  I spent 25 minutes counseling the patient face to face. The total time spent in the appointment was 30 minutes.    Drue Second, MD Medical/Oncology [facility] [phone] (beeper) [phone] (Office)  08/19/2012, 8:59 AM

## 2012-08-19 NOTE — Patient Instructions (Addendum)
Continue arimidex  Bone density prior to next visit  Exercise and eat healthy

## 2012-10-13 ENCOUNTER — Other Ambulatory Visit: Payer: Self-pay | Admitting: *Deleted

## 2012-10-13 DIAGNOSIS — M899 Disorder of bone, unspecified: Secondary | ICD-10-CM

## 2012-10-13 MED ORDER — ALENDRONATE SODIUM 70 MG PO TABS: 70.0000 mg | ORAL_TABLET | ORAL | Status: DC

## 2012-11-26 ENCOUNTER — Other Ambulatory Visit: Payer: Self-pay

## 2013-02-01 ENCOUNTER — Other Ambulatory Visit: Payer: Self-pay

## 2013-02-01 DIAGNOSIS — Z9012 Acquired absence of left breast and nipple: Secondary | ICD-10-CM

## 2013-02-01 DIAGNOSIS — Z853 Personal history of malignant neoplasm of breast: Secondary | ICD-10-CM

## 2013-02-01 DIAGNOSIS — Z1231 Encounter for screening mammogram for malignant neoplasm of breast: Secondary | ICD-10-CM

## 2013-02-19 ENCOUNTER — Ambulatory Visit
Admission: RE | Admit: 2013-02-19 | Discharge: 2013-02-19 | Disposition: A | Discharge status: Home / Self Care | Payer: BC Managed Care – PPO | Source: Ambulatory Visit

## 2013-02-19 ENCOUNTER — Ambulatory Visit
Admission: RE | Admit: 2013-02-19 | Discharge: 2013-02-19 | Disposition: A | Discharge status: Home / Self Care | Payer: BC Managed Care – PPO | Source: Ambulatory Visit | Attending: Oncology | Admitting: Oncology

## 2013-02-19 ENCOUNTER — Other Ambulatory Visit: Payer: BC Managed Care – PPO | Admitting: Lab

## 2013-02-19 ENCOUNTER — Ambulatory Visit: Payer: BC Managed Care – PPO | Admitting: Oncology

## 2013-02-19 DIAGNOSIS — Z9012 Acquired absence of left breast and nipple: Secondary | ICD-10-CM

## 2013-02-19 DIAGNOSIS — Z1231 Encounter for screening mammogram for malignant neoplasm of breast: Secondary | ICD-10-CM

## 2013-02-19 DIAGNOSIS — M8580 Other specified disorders of bone density and structure, unspecified site: Secondary | ICD-10-CM

## 2013-02-19 DIAGNOSIS — Z853 Personal history of malignant neoplasm of breast: Secondary | ICD-10-CM

## 2013-02-22 ENCOUNTER — Other Ambulatory Visit (HOSPITAL_BASED_OUTPATIENT_CLINIC_OR_DEPARTMENT_OTHER): Payer: BC Managed Care – PPO

## 2013-02-22 ENCOUNTER — Encounter: Payer: Self-pay | Admitting: Oncology

## 2013-02-22 ENCOUNTER — Ambulatory Visit (HOSPITAL_BASED_OUTPATIENT_CLINIC_OR_DEPARTMENT_OTHER): Payer: BC Managed Care – PPO | Admitting: Oncology

## 2013-02-22 ENCOUNTER — Telehealth: Payer: Self-pay | Admitting: Oncology

## 2013-02-22 VITALS — BP 138/83 | HR 75 | Temp 98.2°F | Resp 18 | Ht 70.0 in | Wt 145.0 lb

## 2013-02-22 DIAGNOSIS — M899 Disorder of bone, unspecified: Secondary | ICD-10-CM

## 2013-02-22 DIAGNOSIS — C50912 Malignant neoplasm of unspecified site of left female breast: Secondary | ICD-10-CM

## 2013-02-22 DIAGNOSIS — C50919 Malignant neoplasm of unspecified site of unspecified female breast: Secondary | ICD-10-CM

## 2013-02-22 DIAGNOSIS — M949 Disorder of cartilage, unspecified: Secondary | ICD-10-CM

## 2013-02-22 DIAGNOSIS — E559 Vitamin D deficiency, unspecified: Secondary | ICD-10-CM

## 2013-02-22 DIAGNOSIS — Z170 Estrogen receptor positive status [ER+]: Secondary | ICD-10-CM

## 2013-02-22 LAB — CBC WITH DIFFERENTIAL/PLATELET
BASO%: 1.8 % (ref 0.0–2.0)
BASOS ABS: 0.1 10*3/uL (ref 0.0–0.1)
EOS ABS: 0.1 10*3/uL (ref 0.0–0.5)
EOS%: 1 % (ref 0.0–7.0)
HCT: 42.3 % (ref 34.8–46.6)
HEMOGLOBIN: 14.2 g/dL (ref 11.6–15.9)
LYMPH#: 1.3 10*3/uL (ref 0.9–3.3)
LYMPH%: 21.8 % (ref 14.0–49.7)
MCH: 30.8 pg (ref 25.1–34.0)
MCHC: 33.7 g/dL (ref 31.5–36.0)
MCV: 91.6 fL (ref 79.5–101.0)
MONO#: 0.5 10*3/uL (ref 0.1–0.9)
MONO%: 8.9 % (ref 0.0–14.0)
NEUT%: 66.5 % (ref 38.4–76.8)
NEUTROS ABS: 4 10*3/uL (ref 1.5–6.5)
Platelets: 206 10*3/uL (ref 145–400)
RBC: 4.61 10*6/uL (ref 3.70–5.45)
RDW: 13 % (ref 11.2–14.5)
WBC: 6 10*3/uL (ref 3.9–10.3)

## 2013-02-22 LAB — COMPREHENSIVE METABOLIC PANEL (CC13)
ALBUMIN: 4.3 g/dL (ref 3.5–5.0)
ALK PHOS: 51 U/L (ref 40–150)
ALT: 15 U/L (ref 0–55)
AST: 14 U/L (ref 5–34)
Anion Gap: 11 mEq/L (ref 3–11)
BUN: 11.3 mg/dL (ref 7.0–26.0)
CALCIUM: 9.9 mg/dL (ref 8.4–10.4)
CHLORIDE: 105 meq/L (ref 98–109)
CO2: 29 mEq/L (ref 22–29)
Creatinine: 0.9 mg/dL (ref 0.6–1.1)
GLUCOSE: 94 mg/dL (ref 70–140)
POTASSIUM: 4.2 meq/L (ref 3.5–5.1)
SODIUM: 145 meq/L (ref 136–145)
TOTAL PROTEIN: 6.8 g/dL (ref 6.4–8.3)
Total Bilirubin: 0.44 mg/dL (ref 0.20–1.20)

## 2013-02-22 NOTE — Telephone Encounter (Signed)
, °

## 2013-02-22 NOTE — Patient Instructions (Signed)
Doing well  We will see you back in 6 months

## 2013-02-22 NOTE — Progress Notes (Signed)
OFFICE PROGRESS NOTE   CC Dr. Fanny Skates  Dr. Gery Pray  DIAGNOSIS: 58 year old female with T2 N0 ER positive HER-2/neu negative invasive lobular carcinoma of the left breast status post left total mastectomy with sentinel node biopsy on 12/21/2010.  PRIOR THERAPY:  #1 patient was originally seen in the multidisciplinary breast clinic on 12/05/2010 for discussion of her new invasive lobular carcinoma of the left breast. The tumor was at the 12:00 position measuring about 3 cm. The biopsy showed an invasive lobular carcinoma ER positive PR positive proliferation marker 15% and HER-2/neu negative.  #2 she is now left total mastectomy with sentinel node biopsy on 12/21/2010. The final pathology revealed an invasive lobular carcinoma pathologic stage TII N0 ER positive PR positive HER-2/neu negative. Proliferation marker 15%.  #3 patient had an Oncotype DX score performed her breast cancer recurrence score was 6 giving her an average rate of distant recurrence about 5% with antiestrogen therapy.  #4 Arimidex 1 mg daily since December 2012  CURRENT THERAPY: Curative intent antiestrogen therapy consisting of Arimidex 1 mg daily  INTERVAL HISTORY: Daisy Thomas 58 y.o. female returns for followup visit today. Overall she is doing well. She denies any fevers chills night sweats headaches shortness of breath chest pains palpitations she has no myalgias or arthralgias. Her mastectomy site is healing quite well. She is tolerating her AI well and no sied effects are reported.  Remainder of the 10 point review of systems is negative.   MEDICAL HISTORY: Past Medical History  Diagnosis Date  . PONV (postoperative nausea and vomiting)   . Heart murmur     NO TX, OR MEDS  . Breast cancer 12/05/2010    left    ALLERGIES:  is allergic to other.  MEDICATIONS:  Current Outpatient Prescriptions  Medication Sig Dispense Refill  . alendronate (FOSAMAX) 70 MG tablet Take 1 tablet (70 mg  total) by mouth every 7 (seven) days. Take with a full glass of water on an empty stomach.  4 tablet  5  . anastrozole (ARIMIDEX) 1 MG tablet Take 1 tablet (1 mg total) by mouth daily.  90 tablet  12  . calcium-vitamin D (OSCAL WITH D) 500-200 MG-UNIT per tablet Take 1 tablet by mouth daily.       No current facility-administered medications for this visit.   Facility-Administered Medications Ordered in Other Visits  Medication Dose Route Frequency Provider Last Rate Last Dose  . heparin injection 5,000 Units  5,000 Units Subcutaneous Once Adin Hector, MD        SURGICAL HISTORY:  Past Surgical History  Procedure Laterality Date  . Myomectomy  1986    ovarian fibroid  . Cesarean section  1990  . Mastectomy w/ sentinel node biopsy  12/21/2010    Procedure: MASTECTOMY WITH SENTINEL LYMPH NODE BIOPSY;  Surgeon: Adin Hector, MD;  Location: [facility];  Service: General;  Laterality: Left;  Left total mastectomy & sentinel lymph node biopsy  . Breast lumpectomy      left breast   . Myomectomy    . Breast lumpectomy      left breast, benign- Dr. March Rummage    REVIEW OF SYSTEMS:  Pertinent items are noted in HPI.   PHYSICAL EXAMINATION: General appearance: alert, cooperative and appears stated age Head: Normocephalic, without obvious abnormality, atraumatic Neck: no adenopathy, no carotid bruit, no JVD, supple, symmetrical, trachea midline and thyroid not enlarged, symmetric, no tenderness/mass/nodules Lymph nodes: Cervical, supraclavicular, and axillary nodes normal. Resp: clear  to auscultation bilaterally and normal percussion bilaterally Back: symmetric, no curvature. ROM normal. No CVA tenderness. Cardio: regular rate and rhythm, S1, S2 normal, no murmur, click, rub or gallop GI: soft, non-tender; bowel sounds normal; no masses,  no organomegaly Extremities: extremities normal, atraumatic, no cyanosis or edema Neurologic: Alert and oriented X 3, normal strength and tone. Normal  symmetric reflexes. Normal coordination and gait Breasts: breasts appear normal, no suspicious masses, no skin or nipple changes or axillary nodes.  ECOG PERFORMANCE STATUS: 0 - Asymptomatic  Blood pressure 138/83, pulse 75, temperature 98.2 F (36.8 C), temperature source Oral, resp. rate 18, height _0  (1.778 m), weight 145 lb (65.772 kg).  LABORATORY DATA: Lab Results  Component Value Date   WBC 6.0 02/22/2013   HGB 14.2 02/22/2013   HCT 42.3 02/22/2013   MCV 91.6 02/22/2013   PLT 206 02/22/2013      Chemistry      Component Value Date/Time   NA 145 08/19/2012 0826   NA 143 12/18/2010 1532   K 3.9 08/19/2012 0826   K 4.0 12/18/2010 1532   CL 105 10/11/2011 1045   CL 105 12/18/2010 1532   CO2 28 08/19/2012 0826   CO2 29 12/18/2010 1532   BUN 11.0 08/19/2012 0826   BUN 6 12/18/2010 1532   CREATININE 0.9 08/19/2012 0826   CREATININE 0.78 12/18/2010 1532      Component Value Date/Time   CALCIUM 9.7 08/19/2012 0826   CALCIUM 9.7 12/18/2010 1532   ALKPHOS 46 08/19/2012 0826   ALKPHOS 61 12/18/2010 1532   AST 12 08/19/2012 0826   AST 12 12/18/2010 1532   ALT 11 08/19/2012 0826   ALT 13 12/18/2010 1532   BILITOT 0.47 08/19/2012 0826   BILITOT 0.3 12/18/2010 1532       RADIOGRAPHIC STUDIES:      ASSESSMENT/PLAN: 58 year old female with:  1.  stage II ( T2 N0) invasive lobular carcinoma of the left breast status post mastectomy of the left breast. Postoperatively patient is doing extremely well. She had an Oncotype DX performed and the score is low giving her a 5% 5 year risk of rib relapse with antiestrogen therapy this is quite acceptable. She was begun on Arimidex 1 mg daily starting December 2012. She is tolerating it well. She has no evidence of recurrent disease.  #2 bone density: Patient had a bone density scan performed on 02/19/2013. She has low bone mass. This is similar to the scan performed in 2013. She is on Fosamax. We will continue this for now.  #3 patient and I  discussed continuing vitamin D and calcium. Her last vitamin D level was 53 6 months ago. This is very acceptable.  #4 mammogram: Patient is up-to-date. She had a right 3-D mammogram performed on January 30. Results are pending.  #5 we   discussed survivorship. She is encouraged to exercise eat healthy and maintain a good BMI.  #6 patient will be seen back in 6 months time in followup.  2. Receiving arimidex 1 mg daily since 12/12   All questions were answered. The patient knows to call the clinic with any problems, questions or concerns. We can certainly see the patient much sooner if necessary.  I spent 20 minutes counseling the patient face to face. The total time spent in the appointment was 30 minutes.    Marcy Panning, MD Medical/Oncology [facility] [phone] (beeper) [phone] (Office)  02/22/2013, 8:53 AM

## 2013-02-23 ENCOUNTER — Telehealth: Payer: Self-pay | Admitting: *Deleted

## 2013-02-23 LAB — VITAMIN D 25 HYDROXY (VIT D DEFICIENCY, FRACTURES): VIT D 25 HYDROXY: 60 ng/mL (ref 30–89)

## 2013-02-23 NOTE — Telephone Encounter (Signed)
Message copied by Hebert Soho on Tue Feb 23, 2013 10:33 AM ------      Message from: Deatra Robinson      Created: Tue Feb 23, 2013  9:29 AM       Vitamin D level good, no changes, continue present meds ------

## 2013-02-23 NOTE — Telephone Encounter (Signed)
Per Dr. Humphrey Rolls, I informed patient that the Vitamin D level is good. No changes in her medications and to continue present medications. Patient verbalized understanding.

## 2013-05-13 ENCOUNTER — Other Ambulatory Visit: Payer: Self-pay | Admitting: *Deleted

## 2013-05-13 DIAGNOSIS — M949 Disorder of cartilage, unspecified: Principal | ICD-10-CM

## 2013-05-13 DIAGNOSIS — M899 Disorder of bone, unspecified: Secondary | ICD-10-CM

## 2013-05-13 MED ORDER — ALENDRONATE SODIUM 70 MG PO TABS: 70.0000 mg | ORAL_TABLET | ORAL | Status: DC

## 2013-08-12 ENCOUNTER — Telehealth: Payer: Self-pay | Admitting: *Deleted

## 2013-08-12 NOTE — Telephone Encounter (Signed)
Notified pt of future appointments on 9/21. Pt agreed with appt time and date

## 2013-08-23 ENCOUNTER — Ambulatory Visit: Payer: BC Managed Care – PPO | Admitting: Hematology and Oncology

## 2013-08-23 ENCOUNTER — Ambulatory Visit: Payer: BC Managed Care – PPO | Admitting: Oncology

## 2013-08-23 ENCOUNTER — Other Ambulatory Visit: Payer: BC Managed Care – PPO

## 2013-10-07 ENCOUNTER — Other Ambulatory Visit: Payer: Self-pay | Admitting: Oncology

## 2013-10-07 DIAGNOSIS — C50919 Malignant neoplasm of unspecified site of unspecified female breast: Secondary | ICD-10-CM

## 2013-10-08 ENCOUNTER — Other Ambulatory Visit: Payer: Self-pay

## 2013-10-08 DIAGNOSIS — C50919 Malignant neoplasm of unspecified site of unspecified female breast: Secondary | ICD-10-CM

## 2013-10-09 ENCOUNTER — Other Ambulatory Visit: Payer: Self-pay | Admitting: Oncology

## 2013-10-09 DIAGNOSIS — C50919 Malignant neoplasm of unspecified site of unspecified female breast: Secondary | ICD-10-CM

## 2013-10-11 ENCOUNTER — Other Ambulatory Visit (HOSPITAL_BASED_OUTPATIENT_CLINIC_OR_DEPARTMENT_OTHER): Payer: BC Managed Care – PPO

## 2013-10-11 ENCOUNTER — Other Ambulatory Visit: Payer: Self-pay | Admitting: Hematology and Oncology

## 2013-10-11 ENCOUNTER — Ambulatory Visit (HOSPITAL_BASED_OUTPATIENT_CLINIC_OR_DEPARTMENT_OTHER): Payer: BC Managed Care – PPO | Admitting: Hematology and Oncology

## 2013-10-11 ENCOUNTER — Other Ambulatory Visit: Payer: Self-pay | Admitting: *Deleted

## 2013-10-11 ENCOUNTER — Encounter: Payer: Self-pay | Admitting: Hematology and Oncology

## 2013-10-11 VITALS — BP 126/76 | HR 67 | Temp 98.5°F | Resp 18 | Ht 70.0 in | Wt 150.4 lb

## 2013-10-11 DIAGNOSIS — Z170 Estrogen receptor positive status [ER+]: Secondary | ICD-10-CM

## 2013-10-11 DIAGNOSIS — C50919 Malignant neoplasm of unspecified site of unspecified female breast: Secondary | ICD-10-CM

## 2013-10-11 DIAGNOSIS — C50912 Malignant neoplasm of unspecified site of left female breast: Secondary | ICD-10-CM

## 2013-10-11 DIAGNOSIS — Z9012 Acquired absence of left breast and nipple: Secondary | ICD-10-CM

## 2013-10-11 DIAGNOSIS — Z1231 Encounter for screening mammogram for malignant neoplasm of breast: Secondary | ICD-10-CM

## 2013-10-11 DIAGNOSIS — Z9010 Acquired absence of unspecified breast and nipple: Secondary | ICD-10-CM

## 2013-10-11 DIAGNOSIS — M810 Age-related osteoporosis without current pathological fracture: Secondary | ICD-10-CM

## 2013-10-11 LAB — CBC WITH DIFFERENTIAL/PLATELET
BASO%: 0.3 % (ref 0.0–2.0)
Basophils Absolute: 0 10*3/uL (ref 0.0–0.1)
EOS%: 0.3 % (ref 0.0–7.0)
Eosinophils Absolute: 0 10*3/uL (ref 0.0–0.5)
HEMATOCRIT: 40.9 % (ref 34.8–46.6)
HGB: 13.8 g/dL (ref 11.6–15.9)
LYMPH%: 21.8 % (ref 14.0–49.7)
MCH: 30.1 pg (ref 25.1–34.0)
MCHC: 33.7 g/dL (ref 31.5–36.0)
MCV: 89.3 fL (ref 79.5–101.0)
MONO#: 0.6 10*3/uL (ref 0.1–0.9)
MONO%: 8.3 % (ref 0.0–14.0)
NEUT#: 4.7 10*3/uL (ref 1.5–6.5)
NEUT%: 69.3 % (ref 38.4–76.8)
NRBC: 0 % (ref 0–0)
PLATELETS: 202 10*3/uL (ref 145–400)
RBC: 4.58 10*6/uL (ref 3.70–5.45)
RDW: 12.6 % (ref 11.2–14.5)
WBC: 6.7 10*3/uL (ref 3.9–10.3)
lymph#: 1.5 10*3/uL (ref 0.9–3.3)

## 2013-10-11 LAB — COMPREHENSIVE METABOLIC PANEL (CC13)
ALT: 11 U/L (ref 0–55)
AST: 11 U/L (ref 5–34)
Albumin: 4.1 g/dL (ref 3.5–5.0)
Alkaline Phosphatase: 53 U/L (ref 40–150)
Anion Gap: 11 meq/L (ref 3–11)
BUN: 12.1 mg/dL (ref 7.0–26.0)
CO2: 26 meq/L (ref 22–29)
Calcium: 9.5 mg/dL (ref 8.4–10.4)
Chloride: 106 meq/L (ref 98–109)
Creatinine: 0.9 mg/dL (ref 0.6–1.1)
Glucose: 81 mg/dL (ref 70–140)
Potassium: 3.8 meq/L (ref 3.5–5.1)
Sodium: 143 meq/L (ref 136–145)
Total Bilirubin: 0.25 mg/dL (ref 0.20–1.20)
Total Protein: 6.8 g/dL (ref 6.4–8.3)

## 2013-10-11 NOTE — Progress Notes (Signed)
Patient Care Team: No Pcp Per Patient as PCP - General (General Practice)  DIAGNOSIS: Breast cancer   Primary site: Breast (Left)   Staging method: AJCC 7th Edition   Clinical: Stage Unknown (T2, NX, cM0) signed by Deatra Robinson, MD on 12/05/2010  2:07 PM   Pathologic: Stage IIA (T2, N0, cM0) signed by Rulon Eisenmenger, MD on 10/11/2013  2:28 PM   Summary: Stage IIA (T2, N0, cM0)   Prognostic indicators: ER+/PR+ ki-67 15%   SUMMARY OF ONCOLOGIC HISTORY: #1 patient was originally seen in the multidisciplinary breast clinic on 12/05/2010 for discussion of her new invasive lobular carcinoma of the left breast. The tumor was at the 12:00 position measuring about 3 cm. The biopsy showed an invasive lobular carcinoma ER positive PR positive proliferation marker 15% and HER-2/neu negative.   #2 she is now left total mastectomy with sentinel node biopsy on 12/21/2010. The final pathology revealed an invasive lobular carcinoma pathologic stage TII N0 ER positive PR positive HER-2/neu negative. Proliferation marker 15%.   #3 patient had an Oncotype DX score performed her breast cancer recurrence score was 6 giving her an average rate of distant recurrence about 5% with antiestrogen therapy.   #4 Arimidex 1 mg daily since December 2012   CHIEF COMPLIANT: Six-month followup of history of breast cancer  INTERVAL HISTORY: Daisy Thomas is a 58 year old Caucasian lady with above-mentioned history of left breast cancer treated with mastectomy. She did not require chemotherapy and has been on oral antiestrogen therapy with Arimidex since December 2012. She has been tolerating it extremely well without any major problems or concerns. She denies any lumps or nodules in the right breast. She was diagnosed with osteoporosis and was given Fosamax with calcium and vitamin D. Her bone density has improved on the recent bone density done in January 2015. She denies any new concerns or problems.  REVIEW OF SYSTEMS:    Constitutional: Denies fevers, chills or abnormal weight loss Eyes: Denies blurriness of vision Ears, nose, mouth, throat, and face: Denies mucositis or sore throat Respiratory: Denies cough, dyspnea or wheezes Cardiovascular: Denies palpitation, chest discomfort or lower extremity swelling Gastrointestinal:  Denies nausea, heartburn or change in bowel habits Skin: Denies abnormal skin rashes Lymphatics: Denies new lymphadenopathy or easy bruising Neurological:Denies numbness, tingling or new weaknesses Behavioral/Psych: Mood is stable, no new changes  Breast:  denies any pain or lumps or nodules in right breast All other systems were reviewed with the patient and are negative.  I have reviewed the past medical history, past surgical history, social history and family history with the patient and they are unchanged from previous note.  ALLERGIES:  is allergic to other.  MEDICATIONS:  Current Outpatient Prescriptions  Medication Sig Dispense Refill  . alendronate (FOSAMAX) 70 MG tablet Take 1 tablet (70 mg total) by mouth every 7 (seven) days. Take with a full glass of water on an empty stomach.  4 tablet  5  . anastrozole (ARIMIDEX) 1 MG tablet TAKE 1 TABLET BY MOUTH EVERY DAY  90 tablet  0  . calcium-vitamin D (OSCAL WITH D) 500-200 MG-UNIT per tablet Take 1 tablet by mouth daily.       No current facility-administered medications for this visit.   Facility-Administered Medications Ordered in Other Visits  Medication Dose Route Frequency Provider Last Rate Last Dose  . heparin injection 5,000 Units  5,000 Units Subcutaneous Once Fanny Skates, MD        PHYSICAL EXAMINATION: ECOG PERFORMANCE STATUS: 0 -  Asymptomatic  Filed Vitals:   10/11/13 1351  BP: 126/76  Pulse: 67  Temp: 98.5 F (36.9 C)  Resp: 18   Filed Weights   10/11/13 1351  Weight: 150 lb 6.4 oz (68.221 kg)    GENERAL:alert, no distress and comfortable SKIN: skin color, texture, turgor are normal, no  rashes or significant lesions EYES: normal, Conjunctiva are pink and non-injected, sclera clear OROPHARYNX:no exudate, no erythema and lips, buccal mucosa, and tongue normal  NECK: supple, thyroid normal size, non-tender, without nodularity LYMPH:  no palpable lymphadenopathy in the cervical, axillary or inguinal LUNGS: clear to auscultation and percussion with normal breathing effort HEART: regular rate & rhythm and no murmurs and no lower extremity edema ABDOMEN:abdomen soft, non-tender and normal bowel sounds Musculoskeletal:no cyanosis of digits and no clubbing  NEURO: alert & oriented x 3 with fluent speech, no focal motor/sensory deficits BREAST: No palpable masses or nodules in either right breast. No palpable axillary supraclavicular or infraclavicular adenopathy no breast tenderness or nipple discharge.   LABORATORY DATA:  I have reviewed the data as listed   Chemistry      Component Value Date/Time   NA 143 10/11/2013 1337   NA 143 12/18/2010 1532   K 3.8 10/11/2013 1337   K 4.0 12/18/2010 1532   CL 105 10/11/2011 1045   CL 105 12/18/2010 1532   CO2 26 10/11/2013 1337   CO2 29 12/18/2010 1532   BUN 12.1 10/11/2013 1337   BUN 6 12/18/2010 1532   CREATININE 0.9 10/11/2013 1337   CREATININE 0.78 12/18/2010 1532      Component Value Date/Time   CALCIUM 9.5 10/11/2013 1337   CALCIUM 9.7 12/18/2010 1532   ALKPHOS 53 10/11/2013 1337   ALKPHOS 61 12/18/2010 1532   AST 11 10/11/2013 1337   AST 12 12/18/2010 1532   ALT 11 10/11/2013 1337   ALT 13 12/18/2010 1532   BILITOT 0.25 10/11/2013 1337   BILITOT 0.3 12/18/2010 1532       Lab Results  Component Value Date   WBC 6.7 10/11/2013   HGB 13.8 10/11/2013   HCT 40.9 10/11/2013   MCV 89.3 10/11/2013   PLT 202 10/11/2013   NEUTROABS 4.7 10/11/2013     RADIOGRAPHIC STUDIES: I have personally reviewed the radiology reports and agreed with their findings. No results found.   ASSESSMENT & PLAN:  Breast cancer 1. Left breast  cancer : T2 N0 ER positive HER-2/neu negative invasive lobular carcinoma of the left breast status post left total mastectomy with sentinel node biopsy on 12/21/2010.  2. Patient has been on oral antiestrogen therapy with Arimidex for the past 3 years. She is tolerating it very well. Denies any problems or concerns. She gets annual Pap smears and GYN exams. Her bone density test done in January 2015 showed improvement in bone density from -2.2 to -1.8. She will continue calcium and vitamin D supplementation along with Fosamax once a week.  3. She'll be set up for mammograms to be done in February 2016. I will see her back in 6 months for routine followup. Today's breast exam was also normal on the right breast and left mastectomy scar is also without any abnormalities.  4. Discussed the importance of physical exercise in decreasing the likelihood of breast cancer recurrence. Recommended 30 mins daily 6 days a week of either brisk walking or cycling or swimming. Encouraged patient to eat more fruits and vegetables and decrease red meat.      Orders Placed This  Encounter  Procedures  . MM Digital Diagnostic Unilat R    Standing Status: Future     Number of Occurrences:      Standing Expiration Date: 10/11/2014    Order Specific Question:  Reason for Exam (SYMPTOM  OR DIAGNOSIS REQUIRED)    Answer:  History of left breast cancer status post mastectomy. Right breast annual mammogram    Order Specific Question:  Is the patient pregnant?    Answer:  No    Order Specific Question:  Preferred imaging location?    Answer:  [facility]   The patient has a good understanding of the overall plan. she agrees with it. She will call with any problems that may develop before her next visit here.  I spent 15 minutes counseling the patient face to face. The total time spent in the appointment was 20 minutes and more than 50% was on counseling and review of test results    Rulon Eisenmenger,  MD 10/11/2013 2:34 PM

## 2013-10-11 NOTE — Assessment & Plan Note (Addendum)
1. Left breast cancer : T2 N0 ER positive HER-2/neu negative invasive lobular carcinoma of the left breast status post left total mastectomy with sentinel node biopsy on 12/21/2010.  2. Patient has been on oral antiestrogen therapy with Arimidex for the past 3 years. She is tolerating it very well. Denies any problems or concerns. She gets annual Pap smears and GYN exams. Her bone density test done in January 2015 showed improvement in bone density from -2.2 to -1.8. She will continue calcium and vitamin D supplementation along with Fosamax once a week.  3. She'll be set up for mammograms to be done in February 2016. I will see her back in 6 months for routine followup. Today's breast exam was also normal on the right breast and left mastectomy scar is also without any abnormalities.  4. Discussed the importance of physical exercise in decreasing the likelihood of breast cancer recurrence. Recommended 30 mins daily 6 days a week of either brisk walking or cycling or swimming. Encouraged patient to eat more fruits and vegetables and decrease red meat.    

## 2014-01-07 ENCOUNTER — Other Ambulatory Visit: Payer: Self-pay | Admitting: *Deleted

## 2014-01-07 DIAGNOSIS — C50912 Malignant neoplasm of unspecified site of left female breast: Secondary | ICD-10-CM

## 2014-01-07 MED ORDER — ANASTROZOLE 1 MG PO TABS: 1.0000 mg | ORAL_TABLET | Freq: Every day | ORAL | Status: DC

## 2014-02-25 ENCOUNTER — Ambulatory Visit
Admission: RE | Admit: 2014-02-25 | Discharge: 2014-02-25 | Disposition: A | Discharge status: Home / Self Care | Payer: BLUE CROSS/BLUE SHIELD | Source: Ambulatory Visit | Attending: Hematology and Oncology | Admitting: Hematology and Oncology

## 2014-02-25 DIAGNOSIS — Z1231 Encounter for screening mammogram for malignant neoplasm of breast: Secondary | ICD-10-CM

## 2014-02-25 DIAGNOSIS — Z9012 Acquired absence of left breast and nipple: Secondary | ICD-10-CM

## 2014-04-11 ENCOUNTER — Ambulatory Visit (HOSPITAL_BASED_OUTPATIENT_CLINIC_OR_DEPARTMENT_OTHER): Payer: BLUE CROSS/BLUE SHIELD | Admitting: Hematology and Oncology

## 2014-04-11 VITALS — BP 123/79 | HR 65 | Temp 98.1°F | Resp 18 | Ht 70.0 in | Wt 152.3 lb

## 2014-04-11 DIAGNOSIS — C50912 Malignant neoplasm of unspecified site of left female breast: Secondary | ICD-10-CM

## 2014-04-11 DIAGNOSIS — Z170 Estrogen receptor positive status [ER+]: Secondary | ICD-10-CM

## 2014-04-11 NOTE — Progress Notes (Signed)
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 She is tolerating it extremely well without any major problems or concerns. She had osteopenia for which she is on bisphosphonate and it appears to be helping her.  REVIEW OF SYSTEMS:   Constitutional: Denies fevers, chills or abnormal weight loss Eyes: Denies blurriness of vision Ears, nose, mouth, throat, and face: Denies mucositis or sore throat Respiratory: Denies cough, dyspnea or wheezes Cardiovascular: Denies palpitation, chest discomfort or lower extremity swelling Gastrointestinal:  Denies nausea, heartburn or change in bowel habits Skin: Denies abnormal skin rashes Lymphatics: Denies new lymphadenopathy or easy bruising Neurological:Denies numbness, tingling or new weaknesses Behavioral/Psych: Mood is stable, no new changes  Breast:  denies any pain or lumps or nodules in the right breast All other systems were reviewed with the patient and are negative.  I have reviewed the  past medical history, past surgical history, social history and family history with the patient and they are unchanged from previous note.  ALLERGIES:  is allergic to other.  MEDICATIONS:  Current Outpatient Prescriptions  Medication Sig Dispense Refill  . alendronate (FOSAMAX) 70 MG tablet TAKE 1 TABLET BY MOUTH EVERY 7 DAYS. TAKE WITH A FULL GLASS OF WATER ON AN EMPTY STOMACH 4 tablet 6  . anastrozole (ARIMIDEX) 1 MG tablet Take 1 tablet (1 mg total) by mouth daily. 90 tablet 1  . calcium-vitamin D (OSCAL WITH D) 500-200 MG-UNIT per tablet Take 1 tablet by mouth daily.     No current facility-administered medications for this visit.   Facility-Administered Medications Ordered in Other Visits  Medication Dose Route Frequency Provider Last Rate Last Dose  . heparin injection 5,000 Units  5,000 Units Subcutaneous Once Fanny Skates, MD        PHYSICAL EXAMINATION: ECOG PERFORMANCE STATUS: 0 - Asymptomatic  Filed Vitals:   04/11/14 0844  BP: 123/79  Pulse: 65  Temp: 98.1 F (36.7 C)  Resp: 18   Filed Weights   04/11/14 0844  Weight: 152 lb 4.8 oz (69.083 kg)    GENERAL:alert, no distress and comfortable SKIN: skin color, texture, turgor are normal, no rashes or significant lesions EYES: normal, Conjunctiva are pink and non-injected, sclera clear OROPHARYNX:no exudate, no erythema and lips, buccal mucosa, and tongue normal  NECK: supple, thyroid normal size, non-tender, without nodularity LYMPH:  no palpable lymphadenopathy in the cervical, axillary or inguinal LUNGS: clear to auscultation and percussion with normal breathing effort HEART: regular  rate & rhythm and no murmurs and no lower extremity edema ABDOMEN:abdomen soft, non-tender and normal bowel sounds Musculoskeletal:no cyanosis of digits and no clubbing  NEURO: alert & oriented x 3 with fluent speech, no focal motor/sensory deficits BREAST: No palpable masses in the right breast. No palpable axillary  supraclavicular or infraclavicular adenopathy no breast tenderness or nipple discharge. (exam performed in the presence of a chaperone)  LABORATORY DATA:  I have reviewed the data as listed   Chemistry      Component Value Date/Time   NA 143 10/11/2013 1337   NA 143 12/18/2010 1532   K 3.8 10/11/2013 1337   K 4.0 12/18/2010 1532   CL 105 10/11/2011 1045   CL 105 12/18/2010 1532   CO2 26 10/11/2013 1337   CO2 29 12/18/2010 1532   BUN 12.1 10/11/2013 1337   BUN 6 12/18/2010 1532   CREATININE 0.9 10/11/2013 1337   CREATININE 0.78 12/18/2010 1532      Component Value Date/Time   CALCIUM 9.5 10/11/2013 1337   CALCIUM 9.7 12/18/2010 1532   ALKPHOS 53 10/11/2013 1337   ALKPHOS 61 12/18/2010 1532   AST 11 10/11/2013 1337   AST 12 12/18/2010 1532   ALT 11 10/11/2013 1337   ALT 13 12/18/2010 1532   BILITOT 0.25 10/11/2013 1337   BILITOT 0.3 12/18/2010 1532       Lab Results  Component Value Date   WBC 6.7 10/11/2013   HGB 13.8 10/11/2013   HCT 40.9 10/11/2013   MCV 89.3 10/11/2013   PLT 202 10/11/2013   NEUTROABS 4.7 10/11/2013     RADIOGRAPHIC STUDIES: I have personally reviewed the radiology reports and agreed with their findings. Mammogram February 2016 was normal  ASSESSMENT & PLAN:  Breast cancer, left breast Left breast cancer : T2 N0 ER positive HER-2/neu negative invasive lobular carcinoma of the left breast status post left total mastectomy with sentinel node biopsy on 12/21/2010. Currently on Arimidex 1 mg daily from December 2012  Arimidex toxicities: Tolerating it very well without any major problems 1. Patient gets annual Pap smears and GYN exams 2. Bone density January 2015 showed improvement in bone density from -2.2 to -1.8, osteopenia: Continue with calcium and vitamin D along with Fosamax once a week  Breast cancer surveillance: 1. Breast exam 04/11/2014 is normal, left mastectomy scar without any abnormalities 2. Mammogram of the right breast:  02/25/2014 is normal  Survivorship:Discussed the importance of physical exercise in decreasing the likelihood of breast cancer recurrence. Recommended 30 mins daily 6 days a week of either brisk walking or cycling or swimming. Encouraged patient to eat more fruits and vegetables and decrease red meat.   Return to clinic in 1 year for follow-up      No orders of the defined types were placed in this encounter.   The patient has a good understanding of the overall plan. she agrees with it. She will call with any problems that may develop before her next visit here.   Rulon Eisenmenger, MD

## 2014-04-11 NOTE — Assessment & Plan Note (Signed)
Left breast cancer : T2 N0 ER positive HER-2/neu negative invasive lobular carcinoma of the left breast status post left total mastectomy with sentinel node biopsy on 12/21/2010. Currently on Arimidex 1 mg daily from December 2012  Arimidex toxicities: Tolerating it very well without any major problems 1. Patient gets annual Pap smears and GYN exams 2. Bone density January 2015 showed improvement in bone density from -2.2 to -1.8, osteopenia: Continue with calcium and vitamin D along with Fosamax once a week  Breast cancer surveillance: 1. Breast exam 04/11/2014 is normal, left mastectomy scar without any abnormalities 2. Mammogram of the right breast: 02/25/2014 is normal  Survivorship:Discussed the importance of physical exercise in decreasing the likelihood of breast cancer recurrence. Recommended 30 mins daily 6 days a week of either brisk walking or cycling or swimming. Encouraged patient to eat more fruits and vegetables and decrease red meat.   Return to clinic in 1 year for follow-up

## 2014-04-24 ENCOUNTER — Other Ambulatory Visit: Payer: Self-pay | Admitting: Oncology

## 2014-04-25 NOTE — Telephone Encounter (Signed)
Lst OV 04/11/14.  Next OV 04/17/15.  Chart reviewed.

## 2014-07-26 ENCOUNTER — Other Ambulatory Visit: Payer: Self-pay | Admitting: Hematology and Oncology

## 2014-07-26 DIAGNOSIS — C50912 Malignant neoplasm of unspecified site of left female breast: Secondary | ICD-10-CM

## 2014-10-18 ENCOUNTER — Other Ambulatory Visit: Payer: Self-pay | Admitting: *Deleted

## 2014-10-18 DIAGNOSIS — C50912 Malignant neoplasm of unspecified site of left female breast: Secondary | ICD-10-CM

## 2014-10-18 MED ORDER — ALENDRONATE SODIUM 70 MG PO TABS: ORAL_TABLET | ORAL | Status: DC

## 2015-04-17 ENCOUNTER — Telehealth: Payer: Self-pay | Admitting: Hematology and Oncology

## 2015-04-17 ENCOUNTER — Encounter: Payer: Self-pay | Admitting: Hematology and Oncology

## 2015-04-17 ENCOUNTER — Ambulatory Visit (HOSPITAL_BASED_OUTPATIENT_CLINIC_OR_DEPARTMENT_OTHER): Payer: BLUE CROSS/BLUE SHIELD | Admitting: Hematology and Oncology

## 2015-04-17 VITALS — BP 135/64 | HR 70 | Temp 97.7°F | Resp 18 | Wt 147.2 lb

## 2015-04-17 DIAGNOSIS — Z79811 Long term (current) use of aromatase inhibitors: Secondary | ICD-10-CM | POA: Diagnosis not present

## 2015-04-17 DIAGNOSIS — M8580 Other specified disorders of bone density and structure, unspecified site: Secondary | ICD-10-CM | POA: Diagnosis not present

## 2015-04-17 DIAGNOSIS — C50412 Malignant neoplasm of upper-outer quadrant of left female breast: Secondary | ICD-10-CM

## 2015-04-17 DIAGNOSIS — Z170 Estrogen receptor positive status [ER+]: Secondary | ICD-10-CM | POA: Diagnosis not present

## 2015-04-17 NOTE — Telephone Encounter (Signed)
Gave patient avs report and appointment for March 2018. Patient will call [facility] for mammo/bone density test appointments due to in April 2017.

## 2015-04-17 NOTE — Progress Notes (Signed)
 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 She appears to be tolerating it extremely well. Denies any major problems or concerns with Arimidex. She has not had a mammogram this year.  REVIEW OF SYSTEMS:   Constitutional: Denies fevers, chills or abnormal weight loss Eyes: Denies blurriness of vision Ears, nose, mouth, throat, and face: Denies mucositis or sore throat Respiratory: Denies cough, dyspnea or wheezes Cardiovascular: Denies palpitation, chest discomfort Gastrointestinal:  Denies nausea, heartburn or change in bowel habits Skin: Denies abnormal skin rashes Lymphatics: Denies new lymphadenopathy or easy bruising Neurological:Denies numbness, tingling or new weaknesses Behavioral/Psych: Mood is stable, no new changes  Extremities: No lower extremity edema Breast:  denies any pain or lumps or nodules in either breasts All other systems were reviewed with the patient and are negative.  I have reviewed the past medical history, past surgical  history, social history and family history with the patient and they are unchanged from previous note.  ALLERGIES:  is allergic to other.  MEDICATIONS:  Current Outpatient Prescriptions  Medication Sig Dispense Refill  . alendronate (FOSAMAX) 70 MG tablet TAKE 1 TABLET BY MOUTH EVERY WEEK WITH A FULL GLASS OF WATER ON EMPTY STOMACH 52 tablet 4  . anastrozole (ARIMIDEX) 1 MG tablet TAKE 1 TABLET BY MOUTH EVERY DAY 90 tablet 3  . calcium-vitamin D (OSCAL WITH D) 500-200 MG-UNIT per tablet Take 1 tablet by mouth daily.     No current facility-administered medications for this visit.   Facility-Administered Medications Ordered in Other Visits  Medication Dose Route Frequency Provider Last Rate Last Dose  . heparin injection 5,000 Units  5,000 Units Subcutaneous Once Haywood Ingram, MD        PHYSICAL EXAMINATION: ECOG PERFORMANCE STATUS: 0 - Asymptomatic  Filed Vitals:   04/17/15 0858  BP: 135/64  Pulse: 70  Temp: 97.7 F (36.5 C)  Resp: 18   Filed Weights   04/17/15 0858  Weight: 147 lb 3.2 oz (66.769 kg)    GENERAL:alert, no distress and comfortable SKIN: skin color, texture, turgor are normal, no rashes or significant lesions EYES: normal, Conjunctiva are pink and non-injected, sclera clear OROPHARYNX:no exudate, no erythema and lips, buccal mucosa, and tongue normal  NECK: supple, thyroid normal size, non-tender, without nodularity LYMPH:  no palpable lymphadenopathy in the cervical, axillary or inguinal LUNGS: clear to auscultation and percussion with normal breathing effort HEART: regular rate & rhythm and no murmurs and no lower extremity edema ABDOMEN:abdomen   soft, non-tender and normal bowel sounds MUSCULOSKELETAL:no cyanosis of digits and no clubbing  NEURO: alert & oriented x 3 with fluent speech, no focal motor/sensory deficits EXTREMITIES: No lower extremity edema BREAST:No palpable lumps or nodules in the right breast or left chest wall or axilla. No palpable  axillary supraclavicular or infraclavicular adenopathy no breast tenderness or nipple discharge. (exam performed in the presence of a chaperone)  LABORATORY DATA:  I have reviewed the data as listed   Chemistry      Component Value Date/Time   NA 143 10/11/2013 1337   NA 143 12/18/2010 1532   K 3.8 10/11/2013 1337   K 4.0 12/18/2010 1532   CL 105 10/11/2011 1045   CL 105 12/18/2010 1532   CO2 26 10/11/2013 1337   CO2 29 12/18/2010 1532   BUN 12.1 10/11/2013 1337   BUN 6 12/18/2010 1532   CREATININE 0.9 10/11/2013 1337   CREATININE 0.78 12/18/2010 1532      Component Value Date/Time   CALCIUM 9.5 10/11/2013 1337   CALCIUM 9.7 12/18/2010 1532   ALKPHOS 53 10/11/2013 1337   ALKPHOS 61 12/18/2010 1532   AST 11 10/11/2013 1337   AST 12 12/18/2010 1532   ALT 11 10/11/2013 1337   ALT 13 12/18/2010 1532   BILITOT 0.25 10/11/2013 1337   BILITOT 0.3 12/18/2010 1532       Lab Results  Component Value Date   WBC 6.7 10/11/2013   HGB 13.8 10/11/2013   HCT 40.9 10/11/2013   MCV 89.3 10/11/2013   PLT 202 10/11/2013   NEUTROABS 4.7 10/11/2013     ASSESSMENT & PLAN:  Breast cancer of upper-outer quadrant of left female breast ([place]) Left breast cancer : T2 N0 ER positive HER-2/neu negative invasive lobular carcinoma of the left breast status post left total mastectomy with sentinel node biopsy on 12/21/2010. Currently on Arimidex 1 mg daily from December 2012  Arimidex toxicities: Tolerating it very well without any major problems 1. Patient gets annual Pap smears and GYN exams 2. Bone density January 2015 showed improvement in bone density from -2.2 to -1.8, osteopenia: Continue with calcium and vitamin D along with Fosamax once a week I recommended obtaining a breast cancer and asked to determine if she would benefit from extended adjuvant therapy. She would finish 5 years of anastrozole by December 2017.  Breast cancer surveillance: 1. Breast exam 04/17/2015 is normal, left  mastectomy scar without any abnormalities 2. Mammogram of the right breast: 02/25/2014 is normal, will need a new mammogram for 2017.  Survivorship:Discussed the importance of physical exercise in decreasing the likelihood of breast cancer recurrence. Recommended 30 mins daily 6 days a week of either brisk walking or cycling or swimming. Encouraged patient to eat more fruits and vegetables and decrease red meat.   Return to clinic in 1 year for follow-up   No orders of the defined types were placed in this encounter.   The patient has a good understanding of the overall plan. she agrees with it. she will call with any problems that may develop before the next visit here.   Rulon Eisenmenger, MD 04/17/2015

## 2015-04-17 NOTE — Progress Notes (Signed)
Patient Care Team: No Pcp Per Patient as PCP - General (General Practice)  DIAGNOSIS: Breast cancer of upper-outer quadrant of left female breast ([place])   Staging form: Breast, AJCC 7th Edition     Clinical: Stage Unknown (T2, NX, cM0) - Signed by Deatra Robinson, MD on 12/05/2010       Prognostic indicators: ER+/PR+ ki-67 15%      Pathologic: Stage IIA (T2, N0, cM0) - Signed by Rulon Eisenmenger, MD on 10/11/2013       Prognostic indicators: ER+/PR+ ki-67 15%  SUMMARY OF ONCOLOGIC HISTORY:   Breast cancer of upper-outer quadrant of left female breast ([place])   12/21/2010 Surgery Left mastectomy: Invasive lobular cancer T2 N0 M0 stage II a, ER positive, PR positive, HER-2 negative, Ki-67 15%, Oncotype DX score is 6, 5% risk of recurrence, low risk   01/11/2011 -  Anti-estrogen oral therapy Arimidex 1 mg daily   CHIEF COMPLIANT: Follow-up on anastrozole  INTERVAL HISTORY: Daisy Thomas is a 60 year old with above measures 11 breast cancer and underwent mastectomy followed by anastrozole from December 2012. She will complete 5 years of therapy by December 2017. She has not had any hot flashes or myalgias. She had osteopenia and is currently on Fosamax. She needs a mammogram and bone density to be done.  REVIEW OF SYSTEMS:   Constitutional: Denies fevers, chills or abnormal weight loss Eyes: Denies blurriness of vision Ears, nose, mouth, throat, and face: Denies mucositis or sore throat Respiratory: Denies cough, dyspnea or wheezes Cardiovascular: Denies palpitation, chest discomfort Gastrointestinal:  Denies nausea, heartburn or change in bowel habits Skin: Denies abnormal skin rashes Lymphatics: Denies new lymphadenopathy or easy bruising Neurological:Denies numbness, tingling or new weaknesses Behavioral/Psych: Mood is stable, no new changes  Extremities: No lower extremity edema Breast:  denies any pain or lumps or nodules All other systems were reviewed with the patient and are  negative.  I have reviewed the past medical history, past surgical history, social history and family history with the patient and they are unchanged from previous note.  ALLERGIES:  is allergic to other.  MEDICATIONS:  Current Outpatient Prescriptions  Medication Sig Dispense Refill  . alendronate (FOSAMAX) 70 MG tablet TAKE 1 TABLET BY MOUTH EVERY WEEK WITH A FULL GLASS OF WATER ON EMPTY STOMACH 52 tablet 4  . anastrozole (ARIMIDEX) 1 MG tablet TAKE 1 TABLET BY MOUTH EVERY DAY 90 tablet 3  . calcium-vitamin D (OSCAL WITH D) 500-200 MG-UNIT per tablet Take 1 tablet by mouth daily.     No current facility-administered medications for this visit.   Facility-Administered Medications Ordered in Other Visits  Medication Dose Route Frequency Provider Last Rate Last Dose  . heparin injection 5,000 Units  5,000 Units Subcutaneous Once Fanny Skates, MD        PHYSICAL EXAMINATION: ECOG PERFORMANCE STATUS: 0 - Asymptomatic  Filed Vitals:   04/17/15 0858  BP: 135/64  Pulse: 70  Temp: 97.7 F (36.5 C)  Resp: 18   Filed Weights   04/17/15 0858  Weight: 147 lb 3.2 oz (66.769 kg)    GENERAL:alert, no distress and comfortable SKIN: skin color, texture, turgor are normal, no rashes or significant lesions EYES: normal, Conjunctiva are pink and non-injected, sclera clear OROPHARYNX:no exudate, no erythema and lips, buccal mucosa, and tongue normal  NECK: supple, thyroid normal size, non-tender, without nodularity LYMPH:  no palpable lymphadenopathy in the cervical, axillary or inguinal LUNGS: clear to auscultation and percussion with normal breathing effort HEART:  regular rate & rhythm and no murmurs and no lower extremity edema ABDOMEN:abdomen soft, non-tender and normal bowel sounds MUSCULOSKELETAL:no cyanosis of digits and no clubbing  NEURO: alert & oriented x 3 with fluent speech, no focal motor/sensory deficits EXTREMITIES: No lower extremity edema BREAST: Left mastectomy scar  is normal. Right breast is without any lumps or nodules. No palpable axillary supraclavicular or infraclavicular adenopathy no breast tenderness or nipple discharge. (exam performed in the presence of a chaperone)  LABORATORY DATA:  I have reviewed the data as listed   Chemistry      Component Value Date/Time   NA 143 10/11/2013 1337   NA 143 12/18/2010 1532   K 3.8 10/11/2013 1337   K 4.0 12/18/2010 1532   CL 105 10/11/2011 1045   CL 105 12/18/2010 1532   CO2 26 10/11/2013 1337   CO2 29 12/18/2010 1532   BUN 12.1 10/11/2013 1337   BUN 6 12/18/2010 1532   CREATININE 0.9 10/11/2013 1337   CREATININE 0.78 12/18/2010 1532      Component Value Date/Time   CALCIUM 9.5 10/11/2013 1337   CALCIUM 9.7 12/18/2010 1532   ALKPHOS 53 10/11/2013 1337   ALKPHOS 61 12/18/2010 1532   AST 11 10/11/2013 1337   AST 12 12/18/2010 1532   ALT 11 10/11/2013 1337   ALT 13 12/18/2010 1532   BILITOT 0.25 10/11/2013 1337   BILITOT 0.3 12/18/2010 1532      Lab Results  Component Value Date   WBC 6.7 10/11/2013   HGB 13.8 10/11/2013   HCT 40.9 10/11/2013   MCV 89.3 10/11/2013   PLT 202 10/11/2013   NEUTROABS 4.7 10/11/2013   ASSESSMENT & PLAN:  Breast cancer of upper-outer quadrant of left female breast ([place]) Left breast cancer : T2 N0 ER positive HER-2/neu negative invasive lobular carcinoma of the left breast status post left total mastectomy with sentinel node biopsy on 12/21/2010. Currently on Arimidex 1 mg daily from December 2012  Arimidex toxicities: Tolerating it very well without any major problems 1. Patient gets annual Pap smears and GYN exams 2. Bone density January 2015 showed improvement in bone density from -2.2 to -1.8, osteopenia: Continue with calcium and vitamin D along with Fosamax once a week. She will need another bone density test.  I recommended obtaining a breast cancer and asked to determine if she would benefit from extended adjuvant therapy. She would finish 5 years  of anastrozole by December 2017. She wants to wait and see the results of the bone density test to make a final decision whether or not she wants to continue antiestrogen therapy beyond 5 years. I discussed the results of MA 17 clinical trial that it did not improve survival but it did have fewer distant recurrences by taking it for 10 years.  We will touch base in the phone after her bone density to make a final decision.  Breast cancer surveillance: 1. Breast exam 04/17/2015 is normal, left mastectomy scar without any abnormalities 2. Mammogram of the right breast: 02/25/2014 is normal, will need a new mammogram for 2017.  Survivorship:Discussed the importance of physical exercise in decreasing the likelihood of breast cancer recurrence. Recommended 30 mins daily 6 days a week of either brisk walking or cycling or swimming. Encouraged patient to eat more fruits and vegetables and decrease red meat.   Return to clinic in 1 year for follow-up   No orders of the defined types were placed in this encounter.   The patient has a  good understanding of the overall plan. she agrees with it. she will call with any problems that may develop before the next visit here.   Rulon Eisenmenger, MD 04/17/2015     1

## 2015-04-17 NOTE — Assessment & Plan Note (Signed)
Left breast cancer : T2 N0 ER positive HER-2/neu negative invasive lobular carcinoma of the left breast status post left total mastectomy with sentinel node biopsy on 12/21/2010. Currently on Arimidex 1 mg daily from December 2012  Arimidex toxicities: Tolerating it very well without any major problems 1. Patient gets annual Pap smears and GYN exams 2. Bone density January 2015 showed improvement in bone density from -2.2 to -1.8, osteopenia: Continue with calcium and vitamin D along with Fosamax once a week I recommended obtaining a breast cancer and asked to determine if she would benefit from extended adjuvant therapy. She would finish 5 years of anastrozole by December 2017.  Breast cancer surveillance: 1. Breast exam 04/17/2015 is normal, left mastectomy scar without any abnormalities 2. Mammogram of the right breast: 02/25/2014 is normal, will need a new mammogram for 2017.  Survivorship:Discussed the importance of physical exercise in decreasing the likelihood of breast cancer recurrence. Recommended 30 mins daily 6 days a week of either brisk walking or cycling or swimming. Encouraged patient to eat more fruits and vegetables and decrease red meat.   Return to clinic in 1 year for follow-up

## 2015-04-17 NOTE — Progress Notes (Signed)
Unable to get in to exam room prior to MD.  No assessment performed.  

## 2015-04-17 NOTE — Addendum Note (Signed)
Addended by: Prentiss Bells on: 04/17/2015 09:46 AM   Modules accepted: Orders

## 2015-04-18 ENCOUNTER — Other Ambulatory Visit: Payer: Self-pay | Admitting: Hematology and Oncology

## 2015-04-18 DIAGNOSIS — Z1231 Encounter for screening mammogram for malignant neoplasm of breast: Secondary | ICD-10-CM

## 2015-04-18 DIAGNOSIS — Z853 Personal history of malignant neoplasm of breast: Secondary | ICD-10-CM

## 2015-04-18 DIAGNOSIS — Z9012 Acquired absence of left breast and nipple: Secondary | ICD-10-CM

## 2015-06-05 ENCOUNTER — Ambulatory Visit
Admission: RE | Admit: 2015-06-05 | Discharge: 2015-06-05 | Disposition: A | Discharge status: Home / Self Care | Payer: BLUE CROSS/BLUE SHIELD | Source: Ambulatory Visit | Attending: Hematology and Oncology | Admitting: Hematology and Oncology

## 2015-06-05 DIAGNOSIS — Z1231 Encounter for screening mammogram for malignant neoplasm of breast: Secondary | ICD-10-CM

## 2015-06-05 DIAGNOSIS — M8580 Other specified disorders of bone density and structure, unspecified site: Secondary | ICD-10-CM

## 2015-06-05 DIAGNOSIS — Z9012 Acquired absence of left breast and nipple: Secondary | ICD-10-CM

## 2015-06-05 DIAGNOSIS — Z853 Personal history of malignant neoplasm of breast: Secondary | ICD-10-CM

## 2015-08-08 ENCOUNTER — Other Ambulatory Visit: Payer: Self-pay | Admitting: Hematology and Oncology

## 2015-08-09 ENCOUNTER — Other Ambulatory Visit: Payer: Self-pay

## 2015-08-09 DIAGNOSIS — C50412 Malignant neoplasm of upper-outer quadrant of left female breast: Secondary | ICD-10-CM

## 2015-08-09 MED ORDER — ANASTROZOLE 1 MG PO TABS: 1.0000 mg | ORAL_TABLET | Freq: Every day | ORAL | Status: DC

## 2015-12-16 ENCOUNTER — Other Ambulatory Visit: Payer: Self-pay | Admitting: Hematology and Oncology

## 2015-12-16 DIAGNOSIS — C50912 Malignant neoplasm of unspecified site of left female breast: Secondary | ICD-10-CM

## 2016-02-02 ENCOUNTER — Other Ambulatory Visit: Payer: Self-pay | Admitting: Hematology and Oncology

## 2016-02-02 DIAGNOSIS — C50412 Malignant neoplasm of upper-outer quadrant of left female breast: Secondary | ICD-10-CM

## 2016-04-15 ENCOUNTER — Encounter: Payer: Self-pay | Admitting: Hematology and Oncology

## 2016-04-15 ENCOUNTER — Ambulatory Visit (HOSPITAL_BASED_OUTPATIENT_CLINIC_OR_DEPARTMENT_OTHER): Payer: BLUE CROSS/BLUE SHIELD | Admitting: Hematology and Oncology

## 2016-04-15 DIAGNOSIS — C50412 Malignant neoplasm of upper-outer quadrant of left female breast: Secondary | ICD-10-CM

## 2016-04-15 DIAGNOSIS — Z79811 Long term (current) use of aromatase inhibitors: Secondary | ICD-10-CM

## 2016-04-15 DIAGNOSIS — Z170 Estrogen receptor positive status [ER+]: Secondary | ICD-10-CM

## 2016-04-15 MED ORDER — ANASTROZOLE 1 MG PO TABS: 1.0000 mg | ORAL_TABLET | Freq: Every day | ORAL | 3 refills | Status: DC

## 2016-04-15 NOTE — Progress Notes (Signed)
Patient Care Team: No Pcp Per Patient as PCP - General (General Practice)  DIAGNOSIS:  Encounter Diagnosis  Name Primary?  . 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 She is tolerating Arimidex extremely well. She denies hot flashes. She denies any myalgias.  she denies any lumps or nodules in the right breast.  REVIEW OF SYSTEMS:   Constitutional: Denies fevers, chills or abnormal weight loss Eyes: Denies blurriness of vision Ears, nose, mouth, throat, and face: Denies mucositis or sore throat Respiratory: Denies cough, dyspnea or wheezes Cardiovascular: Denies palpitation, chest discomfort Gastrointestinal:  Denies nausea, heartburn or change in bowel habits Skin: Denies abnormal skin rashes Lymphatics: Denies new lymphadenopathy or easy bruising Neurological:Denies numbness, tingling or new weaknesses Behavioral/Psych: Mood is stable, no new changes  Extremities: No lower extremity edema Breast:  denies any pain or lumps or nodules in Right breasts All other systems were reviewed with the patient and are negative.  I have reviewed the past medical history, past surgical history, social history and family history with the patient and they are unchanged from previous note.  ALLERGIES:  is allergic to  other.  MEDICATIONS:  Current Outpatient Prescriptions  Medication Sig Dispense Refill  . alendronate (FOSAMAX) 70 MG tablet TAKE 1 TABLET BY MOUTH ONCE WEEKLY WITH A FULL GLASS OF WATER ON AN EMPTY STOMACH 52 tablet 0  . anastrozole (ARIMIDEX) 1 MG tablet TAKE 1 TABLET BY MOUTH DAILY 90 tablet 0  . calcium-vitamin D (OSCAL WITH D) 500-200 MG-UNIT per tablet Take 1 tablet by mouth daily.     No current facility-administered medications for this visit.    Facility-Administered Medications Ordered in Other Visits  Medication Dose Route Frequency Provider Last Rate Last Dose  . heparin injection 5,000 Units  5,000 Units Subcutaneous Once Fanny Skates, MD        PHYSICAL EXAMINATION: ECOG PERFORMANCE STATUS: 1 - Symptomatic but completely ambulatory  There were no vitals filed for this visit. There were no vitals filed for this visit.  GENERAL:alert, no distress and comfortable SKIN: skin color, texture, turgor are normal, no rashes or significant lesions EYES: normal, Conjunctiva are pink and non-injected, sclera clear OROPHARYNX:no exudate, no erythema and lips, buccal mucosa, and tongue normal  NECK: supple, thyroid normal size, non-tender, without nodularity LYMPH:  no palpable lymphadenopathy in the cervical, axillary or inguinal LUNGS: clear to auscultation and percussion with normal breathing effort HEART: regular rate & rhythm and no murmurs and no lower extremity edema ABDOMEN:abdomen soft, non-tender and normal bowel sounds MUSCULOSKELETAL:no cyanosis of digits and no clubbing  NEURO: alert & oriented x 3 with fluent speech, no focal motor/sensory deficits EXTREMITIES: No lower extremity edema BREAST: No palpable masses or nodules in  Right breast. No palpable lumps or nodules in the  left chest wall or axilla.No palpable axillary supraclavicular or infraclavicular adenopathy no breast tenderness or nipple discharge. (exam performed in the presence of a  chaperone)  LABORATORY DATA:  I have reviewed the data as listed   Chemistry      Component Value Date/Time   NA 143 10/11/2013 1337   K 3.8 10/11/2013 1337   CL 105 10/11/2011 1045   CO2 26 10/11/2013 1337   BUN 12.1 10/11/2013 1337   CREATININE 0.9 10/11/2013 1337      Component Value Date/Time   CALCIUM 9.5 10/11/2013 1337   ALKPHOS 53 10/11/2013 1337   AST 11 10/11/2013 1337   ALT 11 10/11/2013 1337   BILITOT 0.25 10/11/2013 1337       Lab Results  Component Value Date   WBC 6.7 10/11/2013   HGB 13.8 10/11/2013   HCT 40.9 10/11/2013   MCV 89.3 10/11/2013   PLT 202 10/11/2013   NEUTROABS 4.7 10/11/2013    ASSESSMENT & PLAN:  Breast cancer of upper-outer quadrant of left female breast ([place]) Left breast cancer : T2 N0 ER positive HER-2/neu negative invasive lobular carcinoma of the left breast status post left total mastectomy with sentinel node biopsy on 12/21/2010. Currently on Arimidex 1 mg daily from 28-Jan-2011  Arimidex toxicities: Tolerating it very well without any major problems 1. Patient gets annual Pap smears and GYN exams 2. Bone density 06/05/2015: Bone density T score -2 (previously was -2.2 then improved to -1.8) She finished 5 years of anastrozole as low 01-28-16.  I discussed the result of ABCSG 16  Clinical trial which revealed that 7 years of antiestrogen therapy is a prevalent to 10 years.  It is for this reason, I recommended that she remain on antiestrogen therapy for total of 7 years. This would be completed 01/27/18.  Breast cancer surveillance: 1. Breast exam 04/15/2016 is normal, left mastectomy scar without any abnormalities 2. Mammogram of the right breast: 06/05/2015 is normal.  patient had been busy taking care of her elderly parents. Her father passed away in 2016/01/28.  Return to clinic in 1 year for follow-up  I spent 25 minutes talking to the patient of which more than half was spent in counseling and  coordination of care.  No orders of the defined types were placed in this encounter.  The patient has a good understanding of the overall plan. she agrees with it. she will call with any problems that may develop before the next visit here.   Rulon Eisenmenger, MD 04/15/16

## 2016-04-15 NOTE — Assessment & Plan Note (Signed)
Left breast cancer : T2 N0 ER positive HER-2/neu negative invasive lobular carcinoma of the left breast status post left total mastectomy with sentinel node biopsy on 12/21/2010. Currently on Arimidex 1 mg daily from December 2012  Arimidex toxicities: Tolerating it very well without any major problems 1. Patient gets annual Pap smears and GYN exams 2. Bone density 06/05/2015: Bone density T score -2 (previously was -2.2 then improved to -1.8) I recommended obtaining a breast cancer Index to determine if she would benefit from extended adjuvant therapy. She would finished 5 years of anastrozole as low December 2017.  Breast cancer surveillance: 1. Breast exam 04/15/2016 is normal, left mastectomy scar without any abnormalities 2. Mammogram of the right breast: 06/05/2015 is normal.  Return to clinic in 1 year for follow-up

## 2016-06-03 ENCOUNTER — Other Ambulatory Visit: Payer: Self-pay | Admitting: Hematology and Oncology

## 2016-06-03 DIAGNOSIS — Z9012 Acquired absence of left breast and nipple: Secondary | ICD-10-CM

## 2016-06-03 DIAGNOSIS — Z1231 Encounter for screening mammogram for malignant neoplasm of breast: Secondary | ICD-10-CM

## 2016-06-24 ENCOUNTER — Ambulatory Visit
Admission: RE | Admit: 2016-06-24 | Discharge: 2016-06-24 | Disposition: A | Discharge status: Home / Self Care | Payer: BLUE CROSS/BLUE SHIELD | Source: Ambulatory Visit | Attending: Hematology and Oncology | Admitting: Hematology and Oncology

## 2016-06-24 DIAGNOSIS — Z1231 Encounter for screening mammogram for malignant neoplasm of breast: Secondary | ICD-10-CM

## 2016-06-24 DIAGNOSIS — Z9012 Acquired absence of left breast and nipple: Secondary | ICD-10-CM

## 2016-06-25 ENCOUNTER — Other Ambulatory Visit: Payer: Self-pay | Admitting: Hematology and Oncology

## 2016-06-25 DIAGNOSIS — R928 Other abnormal and inconclusive findings on diagnostic imaging of breast: Secondary | ICD-10-CM

## 2016-06-28 ENCOUNTER — Ambulatory Visit
Admission: RE | Admit: 2016-06-28 | Discharge: 2016-06-28 | Disposition: A | Discharge status: Home / Self Care | Payer: BLUE CROSS/BLUE SHIELD | Source: Ambulatory Visit | Attending: Hematology and Oncology | Admitting: Hematology and Oncology

## 2016-06-28 DIAGNOSIS — R928 Other abnormal and inconclusive findings on diagnostic imaging of breast: Secondary | ICD-10-CM

## 2016-09-06 ENCOUNTER — Telehealth: Payer: Self-pay

## 2016-09-06 NOTE — Telephone Encounter (Signed)
Spoke with patient and she is aware of her new appt due to 3/25 pal  Daisy Thomas

## 2017-04-14 ENCOUNTER — Ambulatory Visit: Payer: BLUE CROSS/BLUE SHIELD | Admitting: Hematology and Oncology

## 2017-04-21 ENCOUNTER — Inpatient Hospital Stay: Payer: BLUE CROSS/BLUE SHIELD | Attending: Hematology and Oncology | Admitting: Hematology and Oncology

## 2017-04-21 DIAGNOSIS — Z170 Estrogen receptor positive status [ER+]: Secondary | ICD-10-CM

## 2017-04-21 DIAGNOSIS — Z79811 Long term (current) use of aromatase inhibitors: Secondary | ICD-10-CM

## 2017-04-21 DIAGNOSIS — C50412 Malignant neoplasm of upper-outer quadrant of left female breast: Secondary | ICD-10-CM | POA: Diagnosis not present

## 2017-04-21 MED ORDER — ANASTROZOLE 1 MG PO TABS: 1.0000 mg | ORAL_TABLET | Freq: Every day | ORAL | 2 refills | Status: DC

## 2017-04-21 NOTE — Progress Notes (Signed)
Patient Care Team: Patient, No Pcp Per as PCP - General (General Practice)  DIAGNOSIS:  Encounter Diagnosis  Name Primary?  . 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  This is her seventh year of treatment.  She is complaining of right hip pain as well as a left ankle pain.  It is worse in the morning but when she moves around it gets better.  Apart from that she denies any other problems or concerns.  Denies any lumps or nodules in the breast.  REVIEW OF SYSTEMS:   Constitutional: Denies fevers, chills or abnormal weight loss Eyes: Denies blurriness of vision Ears, nose, mouth, throat, and face: Denies mucositis or sore throat Respiratory: Denies cough, dyspnea or wheezes Cardiovascular: Denies palpitation, chest discomfort Gastrointestinal:  Denies nausea, heartburn or change in bowel habits Skin: Denies abnormal skin rashes Lymphatics: Denies new lymphadenopathy or easy bruising Neurological:Denies numbness, tingling or new weaknesses Behavioral/Psych: Mood is stable, no new changes  Extremities: No lower extremity edema Breast:  denies any pain or lumps or nodules All other systems were reviewed with the patient and are negative.  I have reviewed the past medical history, past surgical history, social history and family history with the  patient and they are unchanged from previous note.  ALLERGIES:  is allergic to other.  MEDICATIONS:  Current Outpatient Medications  Medication Sig Dispense Refill  . alendronate (FOSAMAX) 70 MG tablet TAKE 1 TABLET BY MOUTH ONCE WEEKLY WITH A FULL GLASS OF WATER ON AN EMPTY STOMACH 52 tablet 0  . anastrozole (ARIMIDEX) 1 MG tablet Take 1 tablet (1 mg total) by mouth daily. 90 tablet 3  . calcium-vitamin D (OSCAL WITH D) 500-200 MG-UNIT per tablet Take 1 tablet by mouth daily.     No current facility-administered medications for this visit.    Facility-Administered Medications Ordered in Other Visits  Medication Dose Route Frequency Provider Last Rate Last Dose  . heparin injection 5,000 Units  5,000 Units Subcutaneous Once Fanny Skates, MD        PHYSICAL EXAMINATION: ECOG PERFORMANCE STATUS: 1 - Symptomatic but completely ambulatory  Vitals:   04/21/17 0900  BP: 136/81  Pulse: 76  Resp: 18  Temp: 98.6 F (37 C)  SpO2: 98%   Filed Weights   04/21/17 0900  Weight: 151 lb 1.6 oz (68.5 kg)    GENERAL:alert, no distress and comfortable SKIN: skin color, texture, turgor are normal, no rashes or significant lesions EYES: normal, Conjunctiva are pink and non-injected, sclera clear OROPHARYNX:no exudate, no erythema and lips, buccal mucosa, and tongue normal  NECK: supple, thyroid normal size, non-tender, without nodularity LYMPH:  no palpable lymphadenopathy in the cervical, axillary or inguinal LUNGS: clear to auscultation and percussion with normal breathing effort HEART: regular rate & rhythm and no murmurs and no lower extremity  edema ABDOMEN:abdomen soft, non-tender and normal bowel sounds MUSCULOSKELETAL:no cyanosis of digits and no clubbing  NEURO: alert & oriented x 3 with fluent speech, no focal motor/sensory deficits EXTREMITIES: No lower extremity edema BREAST: No palpable lumps or nodules in the right breast left mastectomy (exam performed in the presence of a  chaperone)  LABORATORY DATA:  I have reviewed the data as listed CMP Latest Ref Rng & Units 10/11/2013 02/22/2013 08/19/2012  Glucose 70 - 140 mg/dl 81 94 84  BUN 7.0 - 26.0 mg/dL 12.1 11.3 11.0  Creatinine 0.6 - 1.1 mg/dL 0.9 0.9 0.9  Sodium 136 - 145 mEq/L 143 145 145  Potassium 3.5 - 5.1 mEq/L 3.8 4.2 3.9  Chloride 98 - 107 mEq/L - - -  CO2 22 - 29 mEq/L '26 29 28  ' Calcium 8.4 - 10.4 mg/dL 9.5 9.9 9.7  Total Protein 6.4 - 8.3 g/dL 6.8 6.8 6.8  Total Bilirubin 0.20 - 1.20 mg/dL 0.25 0.44 0.47  Alkaline Phos 40 - 150 U/L 53 51 46  AST 5 - 34 U/L '11 14 12  ' ALT 0 - 55 U/L '11 15 11    ' Lab Results  Component Value Date   WBC 6.7 10/11/2013   HGB 13.8 10/11/2013   HCT 40.9 10/11/2013   MCV 89.3 10/11/2013   PLT 202 10/11/2013   NEUTROABS 4.7 10/11/2013    ASSESSMENT & PLAN:  Breast cancer of upper-outer quadrant of left female breast ([place]) Left breast cancer : T2 N0 ER positive HER-2/neu negative invasive lobular carcinoma of the left breast status post left total mastectomy with sentinel node biopsy on 12/21/2010. Currently on Arimidex 1 mg daily from 2011/02/10  Arimidex toxicities: Tolerating it very well without any major problems 1. Patient gets annual Pap smears and GYN exams 2. Bone density 06/05/2015: Bone density T score -2 (previously was -2.2 then improved to -1.8)  Based on the results of ABCSG 16  Clinical trial which revealed that 7 years of antiestrogen therapy is a prevalent to 10 years.  It is for this reason, I recommended that she remain on antiestrogen therapy for total of 7 years. This would be completed 2018-02-09.  Breast cancer surveillance: 1. Breast exam 04/21/2017 is normal, left mastectomy scar without any abnormalities 2. Mammogram of the right breast:  06/28/2016 is normal.  patient had been busy taking care of her elderly mother. Her father passed away in Feb 10, 2016.  Return to on an as-needed basis.  Patient will follow with her  gynecologist/PCP for her annual checkups.   No orders of the defined types were placed in this encounter.  The patient has a good understanding of the overall plan. she agrees with it. she will call with any problems that may develop before the next visit here.   Daisy Ohara, MD 04/21/17

## 2017-04-21 NOTE — Assessment & Plan Note (Signed)
Left breast cancer : T2 N0 ER positive HER-2/neu negative invasive lobular carcinoma of the left breast status post left total mastectomy with sentinel node biopsy on 12/21/2010. Currently on Arimidex 1 mg daily from Feb 08, 2011  Arimidex toxicities: Tolerating it very well without any major problems 1. Patient gets annual Pap smears and GYN exams 2. Bone density 06/05/2015: Bone density T score -2 (previously was -2.2 then improved to -1.8)  Based on the results of ABCSG 16  Clinical trial which revealed that 7 years of antiestrogen therapy is a prevalent to 10 years.  It is for this reason, I recommended that she remain on antiestrogen therapy for total of 7 years. This would be completed 2018/02/07.  Breast cancer surveillance: 1. Breast exam 04/21/2017 is normal, left mastectomy scar without any abnormalities 2. Mammogram of the right breast:  06/28/2016 is normal.  patient had been busy taking care of her elderly mother. Her father passed away in 2016-02-08.  Return to on an as-needed basis.  Patient will follow with her gynecologist/PCP for her annual checkups.

## 2017-05-09 ENCOUNTER — Other Ambulatory Visit: Payer: Self-pay | Admitting: Hematology and Oncology

## 2017-05-09 DIAGNOSIS — C50412 Malignant neoplasm of upper-outer quadrant of left female breast: Secondary | ICD-10-CM

## 2017-05-09 DIAGNOSIS — Z170 Estrogen receptor positive status [ER+]: Principal | ICD-10-CM

## 2018-01-19 ENCOUNTER — Other Ambulatory Visit: Payer: Self-pay | Admitting: Hematology and Oncology

## 2018-01-19 DIAGNOSIS — C50412 Malignant neoplasm of upper-outer quadrant of left female breast: Secondary | ICD-10-CM

## 2018-01-19 DIAGNOSIS — Z170 Estrogen receptor positive status [ER+]: Principal | ICD-10-CM

## 2018-10-13 ENCOUNTER — Other Ambulatory Visit: Payer: Self-pay | Admitting: Internal Medicine

## 2018-10-13 DIAGNOSIS — Z1231 Encounter for screening mammogram for malignant neoplasm of breast: Secondary | ICD-10-CM

## 2018-11-27 ENCOUNTER — Other Ambulatory Visit: Payer: Self-pay

## 2018-11-27 ENCOUNTER — Ambulatory Visit
Admission: RE | Admit: 2018-11-27 | Discharge: 2018-11-27 | Disposition: A | Discharge status: Home / Self Care | Payer: BC Managed Care – PPO | Source: Ambulatory Visit | Attending: Internal Medicine | Admitting: Internal Medicine

## 2018-11-27 DIAGNOSIS — Z1231 Encounter for screening mammogram for malignant neoplasm of breast: Secondary | ICD-10-CM

## 2019-11-08 ENCOUNTER — Other Ambulatory Visit: Payer: Self-pay | Admitting: *Deleted

## 2019-11-08 ENCOUNTER — Other Ambulatory Visit: Payer: Self-pay | Admitting: Internal Medicine

## 2019-11-08 DIAGNOSIS — Z1231 Encounter for screening mammogram for malignant neoplasm of breast: Secondary | ICD-10-CM

## 2019-11-23 ENCOUNTER — Other Ambulatory Visit: Payer: Self-pay | Admitting: Internal Medicine

## 2019-11-23 DIAGNOSIS — M810 Age-related osteoporosis without current pathological fracture: Secondary | ICD-10-CM

## 2019-11-23 DIAGNOSIS — R5381 Other malaise: Secondary | ICD-10-CM

## 2019-12-13 ENCOUNTER — Ambulatory Visit
Admission: RE | Admit: 2019-12-13 | Discharge: 2019-12-13 | Disposition: A | Discharge status: Home / Self Care | Payer: BC Managed Care – PPO | Source: Ambulatory Visit | Attending: *Deleted | Admitting: *Deleted

## 2019-12-13 ENCOUNTER — Other Ambulatory Visit: Payer: Self-pay

## 2019-12-13 DIAGNOSIS — Z1231 Encounter for screening mammogram for malignant neoplasm of breast: Secondary | ICD-10-CM

## 2020-03-06 ENCOUNTER — Other Ambulatory Visit: Payer: BC Managed Care – PPO

## 2020-08-04 ENCOUNTER — Other Ambulatory Visit: Payer: Self-pay | Admitting: Internal Medicine

## 2020-08-04 ENCOUNTER — Ambulatory Visit
Admission: RE | Admit: 2020-08-04 | Discharge: 2020-08-04 | Disposition: A | Discharge status: Home / Self Care | Payer: BC Managed Care – PPO | Source: Ambulatory Visit | Attending: Internal Medicine | Admitting: Internal Medicine

## 2020-08-04 ENCOUNTER — Other Ambulatory Visit: Payer: Self-pay

## 2020-08-04 DIAGNOSIS — M810 Age-related osteoporosis without current pathological fracture: Secondary | ICD-10-CM

## 2021-01-19 ENCOUNTER — Other Ambulatory Visit: Payer: Self-pay | Admitting: *Deleted

## 2021-01-19 DIAGNOSIS — Z1231 Encounter for screening mammogram for malignant neoplasm of breast: Secondary | ICD-10-CM

## 2021-01-23 ENCOUNTER — Other Ambulatory Visit: Payer: Self-pay | Admitting: Internal Medicine

## 2021-01-23 DIAGNOSIS — Z1231 Encounter for screening mammogram for malignant neoplasm of breast: Secondary | ICD-10-CM

## 2021-02-02 ENCOUNTER — Ambulatory Visit
Admission: RE | Admit: 2021-02-02 | Discharge: 2021-02-02 | Disposition: A | Discharge status: Home / Self Care | Payer: BC Managed Care – PPO | Source: Ambulatory Visit | Attending: *Deleted | Admitting: *Deleted

## 2021-02-02 DIAGNOSIS — Z1231 Encounter for screening mammogram for malignant neoplasm of breast: Secondary | ICD-10-CM

## 2022-02-05 ENCOUNTER — Other Ambulatory Visit: Payer: Self-pay | Admitting: Internal Medicine

## 2022-02-05 DIAGNOSIS — Z1231 Encounter for screening mammogram for malignant neoplasm of breast: Secondary | ICD-10-CM

## 2022-02-07 ENCOUNTER — Ambulatory Visit
Admission: RE | Admit: 2022-02-07 | Discharge: 2022-02-07 | Disposition: A | Discharge status: Home / Self Care | Payer: BC Managed Care – PPO | Source: Ambulatory Visit | Attending: Internal Medicine | Admitting: Internal Medicine

## 2022-02-07 DIAGNOSIS — Z1231 Encounter for screening mammogram for malignant neoplasm of breast: Secondary | ICD-10-CM

## 2022-07-03 IMAGING — MG MM DIGITAL SCREENING UNILAT*R* W/ TOMO W/ CAD
6 series · 6 of 18 positions shown · non-contrast
Comparison: Previous exam(s).

CLINICAL DATA: Screening.

EXAM:
DIGITAL SCREENING UNILATERAL RIGHT MAMMOGRAM WITH CAD AND
TOMOSYNTHESIS
TECHNIQUE: Right screening digital craniocaudal and mediolateral oblique
mammograms were obtained. Right screening digital breast
tomosynthesis was performed. The images were evaluated with
computer-aided detection.

[R MLO synth-2D]
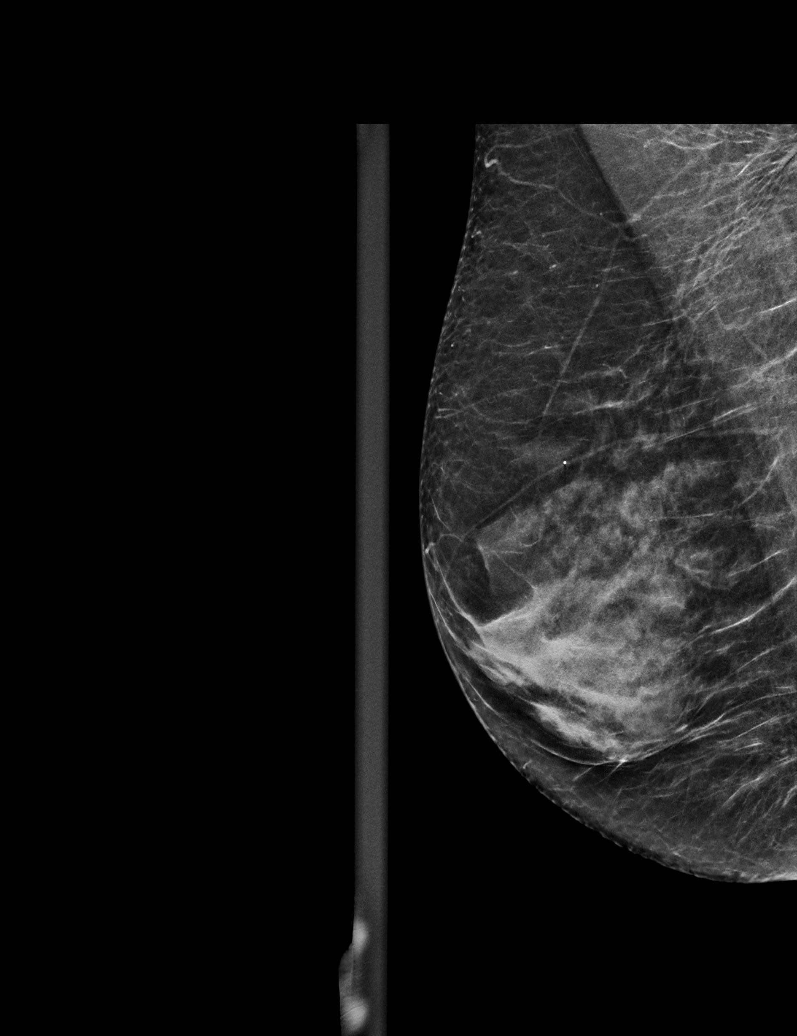

[R XCCL synth-2D]
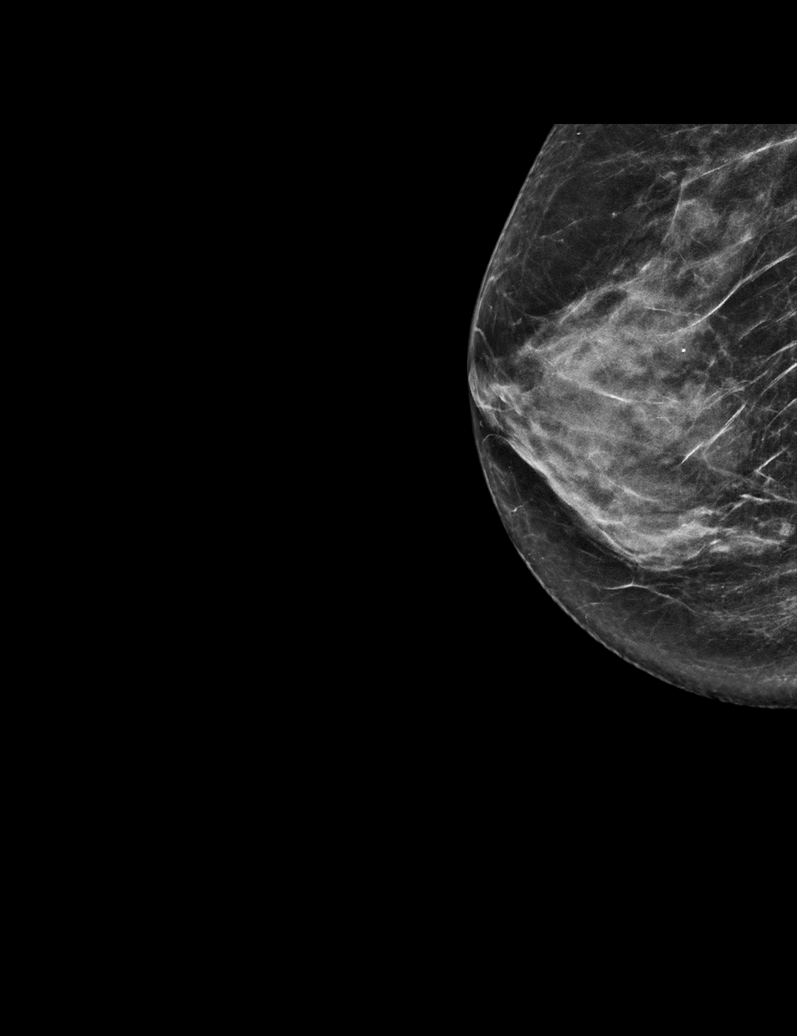

[R CC synth-2D]
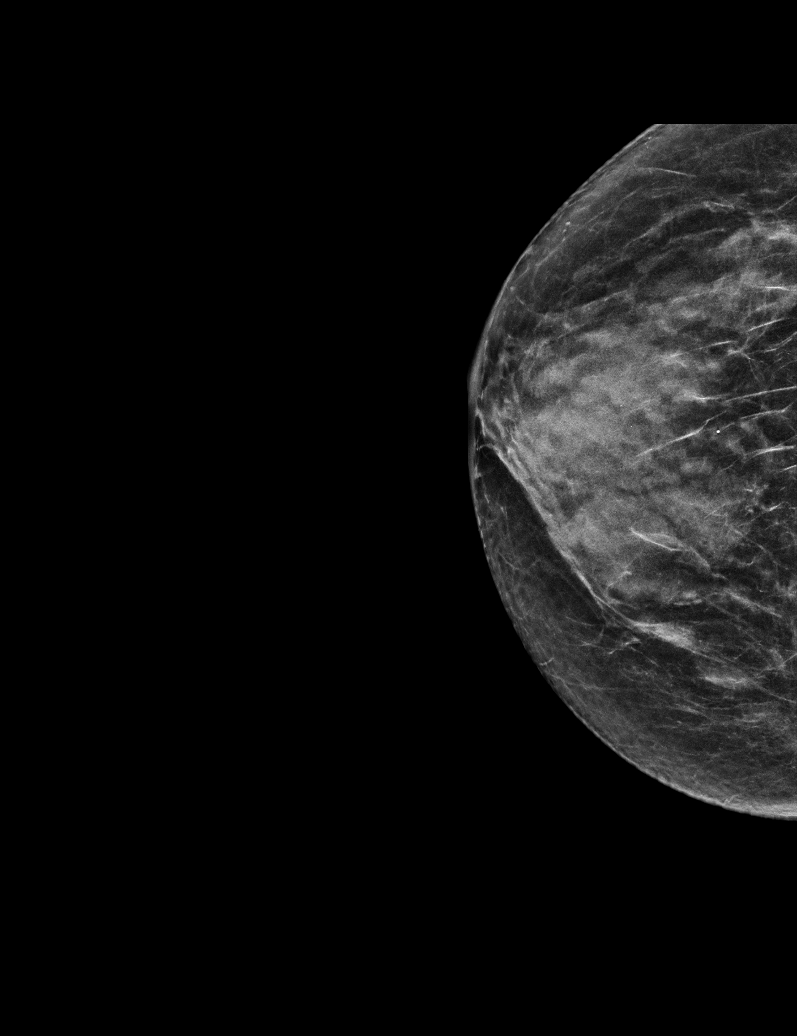

[R XCCL tomo · tomo slice 29/58.0]
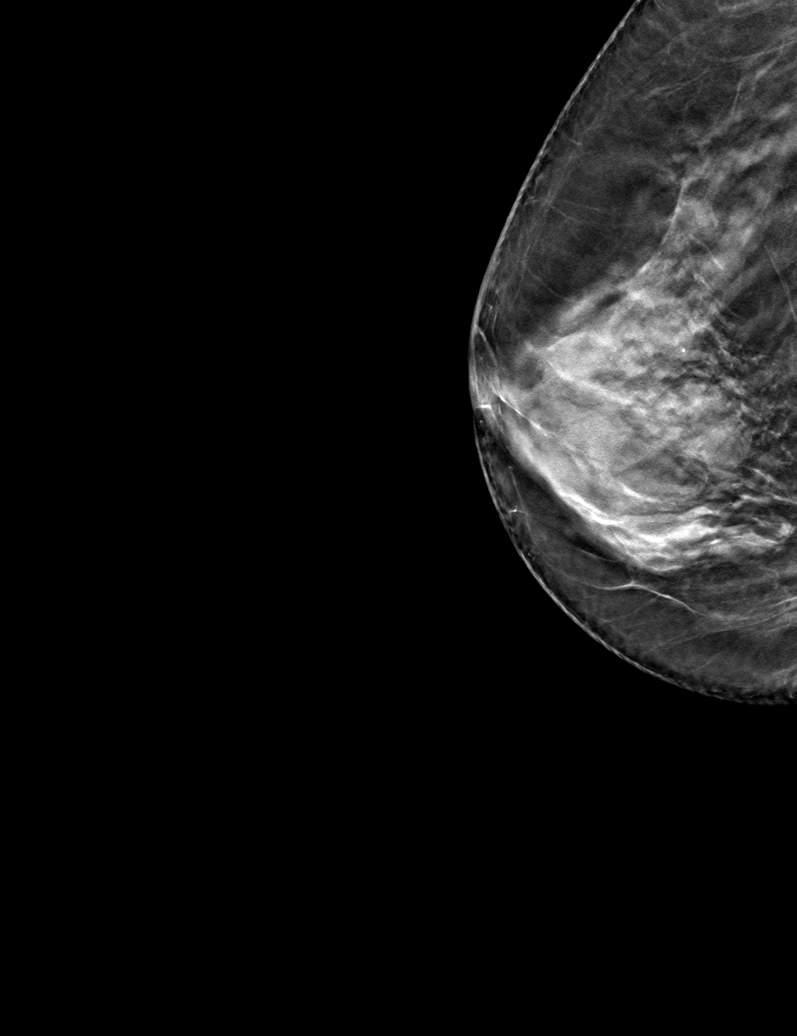

[R CC tomo · tomo slice 27/52.0]
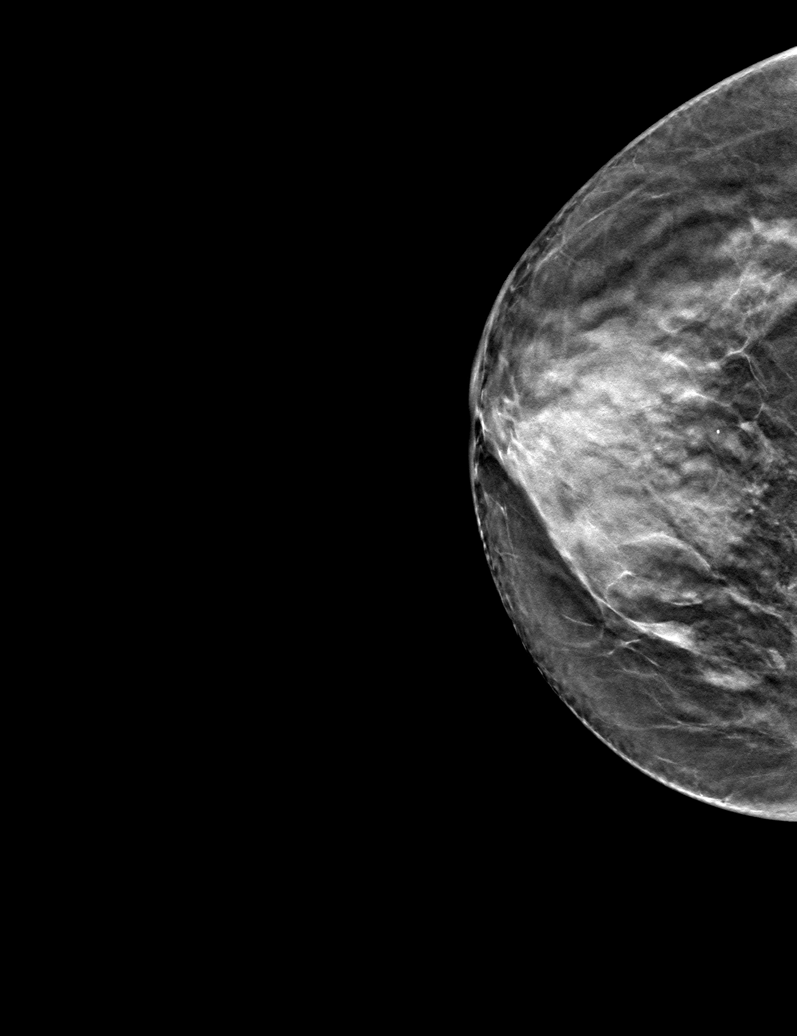

[R MLO tomo · tomo slice 27/53.0]
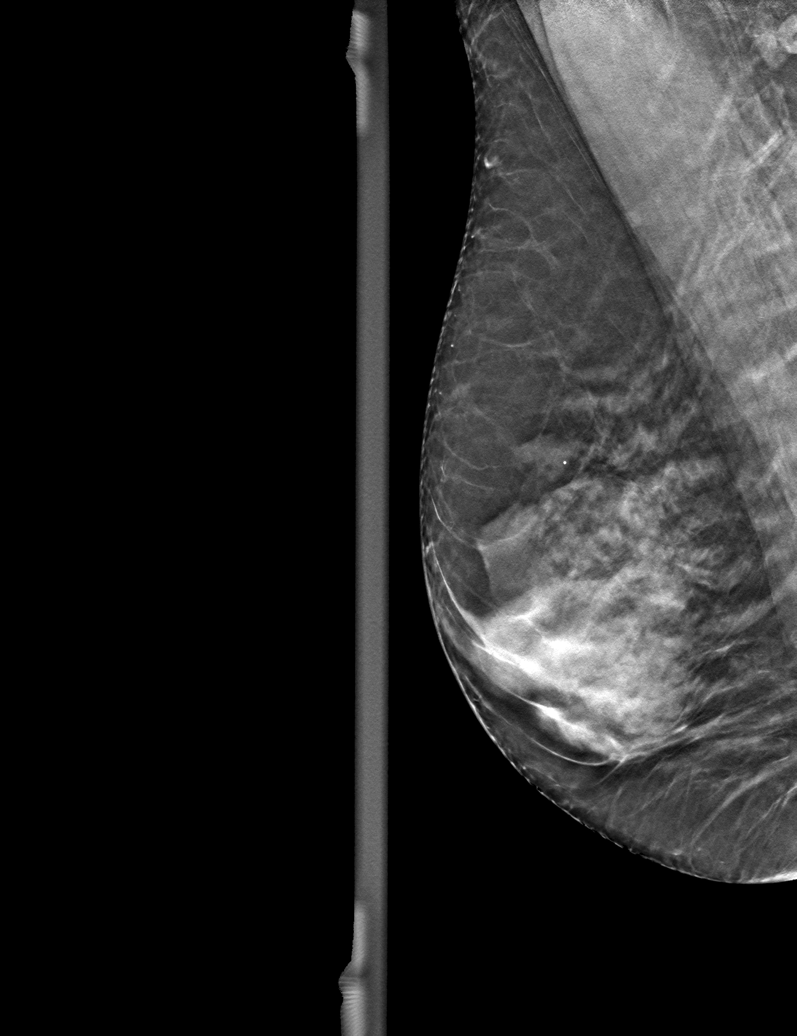

[6 of 18 positions shown; findings below may reference images not displayed]

ACR Breast Density Category c: The breast tissue is heterogeneously
dense, which may obscure small masses.
FINDINGS: There are no findings suspicious for malignancy. Status post LEFT
mastectomy.
IMPRESSION: No mammographic evidence of malignancy. A result letter of this
screening mammogram will be mailed directly to the patient.

RECOMMENDATION:
Screening mammogram in one year. (Code:J3-A-S9L)

BI-RADS CATEGORY  1: Negative.

## 2023-01-08 ENCOUNTER — Other Ambulatory Visit: Payer: Self-pay | Admitting: Internal Medicine

## 2023-01-08 DIAGNOSIS — Z1231 Encounter for screening mammogram for malignant neoplasm of breast: Secondary | ICD-10-CM

## 2023-02-13 ENCOUNTER — Ambulatory Visit
Admission: RE | Admit: 2023-02-13 | Discharge: 2023-02-13 | Disposition: A | Discharge status: Home / Self Care | Payer: BC Managed Care – PPO | Source: Ambulatory Visit | Attending: Internal Medicine | Admitting: Internal Medicine

## 2023-02-13 DIAGNOSIS — Z1231 Encounter for screening mammogram for malignant neoplasm of breast: Secondary | ICD-10-CM

## 2024-01-01 ENCOUNTER — Other Ambulatory Visit: Payer: Self-pay | Admitting: Internal Medicine

## 2024-01-01 DIAGNOSIS — Z1231 Encounter for screening mammogram for malignant neoplasm of breast: Secondary | ICD-10-CM

## 2024-02-16 ENCOUNTER — Ambulatory Visit

## 2024-02-23 ENCOUNTER — Ambulatory Visit

## 2024-02-27 ENCOUNTER — Ambulatory Visit: Admission: RE | Admit: 2024-02-27 | Source: Ambulatory Visit

## 2024-02-27 DIAGNOSIS — Z1231 Encounter for screening mammogram for malignant neoplasm of breast: Secondary | ICD-10-CM
